# Patient Record
Sex: Male | Born: 1940
Health system: Southern US, Community
[De-identification: ages and names within clinical notes are randomized; demographics above are authoritative.]

## PROBLEM LIST (undated history)

## (undated) DIAGNOSIS — K611 Rectal abscess: Secondary | ICD-10-CM

## (undated) DIAGNOSIS — I2510 Atherosclerotic heart disease of native coronary artery without angina pectoris: Secondary | ICD-10-CM

## (undated) DIAGNOSIS — E785 Hyperlipidemia, unspecified: Secondary | ICD-10-CM

## (undated) DIAGNOSIS — J329 Chronic sinusitis, unspecified: Secondary | ICD-10-CM

## (undated) DIAGNOSIS — D649 Anemia, unspecified: Secondary | ICD-10-CM

## (undated) DIAGNOSIS — K284 Chronic or unspecified gastrojejunal ulcer with hemorrhage: Secondary | ICD-10-CM

## (undated) DIAGNOSIS — I10 Essential (primary) hypertension: Secondary | ICD-10-CM

## (undated) DIAGNOSIS — K274 Chronic or unspecified peptic ulcer, site unspecified, with hemorrhage: Secondary | ICD-10-CM

## (undated) DIAGNOSIS — I4891 Unspecified atrial fibrillation: Secondary | ICD-10-CM

## (undated) HISTORY — DX: Chronic or unspecified peptic ulcer, site unspecified, with hemorrhage: K27.4

## (undated) HISTORY — DX: Unspecified atrial fibrillation: I48.91

## (undated) HISTORY — DX: Chronic or unspecified gastrojejunal ulcer with hemorrhage: K28.4

## (undated) HISTORY — DX: Rectal abscess: K61.1

## (undated) HISTORY — DX: Essential (primary) hypertension: I10

## (undated) HISTORY — DX: Chronic sinusitis, unspecified: J32.9

## (undated) HISTORY — DX: Atherosclerotic heart disease of native coronary artery without angina pectoris: I25.10

## (undated) HISTORY — DX: Hyperlipidemia, unspecified: E78.5

## (undated) HISTORY — DX: Anemia, unspecified: D64.9

---

## 2009-05-30 ENCOUNTER — Encounter: Payer: Self-pay | Admitting: Cardiology

## 2009-05-30 ENCOUNTER — Ambulatory Visit: Payer: Self-pay | Admitting: Thoracic Surgery (Cardiothoracic Vascular Surgery)

## 2009-05-30 HISTORY — PX: CORONARY ARTERY BYPASS GRAFT: SHX141

## 2009-05-31 ENCOUNTER — Inpatient Hospital Stay (HOSPITAL_COMMUNITY): Admission: RE | Admit: 2009-05-31 | Discharge: 2009-06-08 | Payer: Self-pay | Admitting: Family Medicine

## 2009-05-31 ENCOUNTER — Encounter: Payer: Self-pay | Admitting: Cardiology

## 2009-06-06 ENCOUNTER — Encounter: Payer: Self-pay | Admitting: Thoracic Surgery (Cardiothoracic Vascular Surgery)

## 2009-06-30 ENCOUNTER — Encounter
Admission: RE | Admit: 2009-06-30 | Discharge: 2009-06-30 | Payer: Self-pay | Admitting: Thoracic Surgery (Cardiothoracic Vascular Surgery)

## 2009-06-30 ENCOUNTER — Encounter: Payer: Self-pay | Admitting: Cardiology

## 2009-06-30 ENCOUNTER — Ambulatory Visit: Payer: Self-pay | Admitting: Thoracic Surgery (Cardiothoracic Vascular Surgery)

## 2009-07-02 ENCOUNTER — Encounter: Payer: Self-pay | Admitting: Cardiology

## 2009-07-08 ENCOUNTER — Ambulatory Visit: Payer: Self-pay | Admitting: Cardiology

## 2009-07-08 LAB — CONVERTED CEMR LAB: POC INR: 1.7

## 2009-07-14 ENCOUNTER — Ambulatory Visit: Payer: Self-pay | Admitting: Thoracic Surgery (Cardiothoracic Vascular Surgery)

## 2009-07-14 ENCOUNTER — Encounter: Payer: Self-pay | Admitting: Cardiology

## 2009-07-14 ENCOUNTER — Encounter
Admission: RE | Admit: 2009-07-14 | Discharge: 2009-07-14 | Payer: Self-pay | Admitting: Thoracic Surgery (Cardiothoracic Vascular Surgery)

## 2009-07-14 LAB — CONVERTED CEMR LAB: POC INR: 1.4

## 2009-07-22 ENCOUNTER — Ambulatory Visit: Payer: Self-pay | Admitting: Cardiology

## 2009-08-01 ENCOUNTER — Ambulatory Visit: Payer: Self-pay | Admitting: Cardiology

## 2009-08-01 ENCOUNTER — Encounter: Payer: Self-pay | Admitting: Cardiology

## 2009-08-01 LAB — CONVERTED CEMR LAB: POC INR: 2

## 2009-08-05 ENCOUNTER — Encounter
Admission: RE | Admit: 2009-08-05 | Discharge: 2009-08-05 | Payer: Self-pay | Admitting: Thoracic Surgery (Cardiothoracic Vascular Surgery)

## 2009-08-05 ENCOUNTER — Ambulatory Visit: Payer: Self-pay | Admitting: Thoracic Surgery (Cardiothoracic Vascular Surgery)

## 2009-08-18 DIAGNOSIS — I10 Essential (primary) hypertension: Secondary | ICD-10-CM | POA: Insufficient documentation

## 2009-08-18 DIAGNOSIS — R072 Precordial pain: Secondary | ICD-10-CM

## 2009-08-18 DIAGNOSIS — E785 Hyperlipidemia, unspecified: Secondary | ICD-10-CM | POA: Insufficient documentation

## 2009-08-18 DIAGNOSIS — I4891 Unspecified atrial fibrillation: Secondary | ICD-10-CM | POA: Insufficient documentation

## 2009-08-18 DIAGNOSIS — I2510 Atherosclerotic heart disease of native coronary artery without angina pectoris: Secondary | ICD-10-CM | POA: Insufficient documentation

## 2009-08-19 ENCOUNTER — Ambulatory Visit: Payer: Self-pay | Admitting: Cardiology

## 2009-08-19 DIAGNOSIS — I2589 Other forms of chronic ischemic heart disease: Secondary | ICD-10-CM | POA: Insufficient documentation

## 2009-08-19 LAB — CONVERTED CEMR LAB: POC INR: 2.2

## 2009-09-04 ENCOUNTER — Encounter: Payer: Self-pay | Admitting: Cardiology

## 2009-09-09 ENCOUNTER — Ambulatory Visit: Payer: Self-pay | Admitting: Cardiology

## 2009-09-09 LAB — CONVERTED CEMR LAB: POC INR: 1.7

## 2009-09-26 ENCOUNTER — Ambulatory Visit: Payer: Self-pay | Admitting: Cardiology

## 2009-10-17 ENCOUNTER — Ambulatory Visit: Payer: Self-pay | Admitting: Cardiology

## 2009-11-04 ENCOUNTER — Ambulatory Visit: Payer: Self-pay | Admitting: Cardiology

## 2009-11-04 ENCOUNTER — Encounter: Payer: Self-pay | Admitting: Cardiology

## 2009-11-10 ENCOUNTER — Ambulatory Visit: Payer: Self-pay | Admitting: Cardiology

## 2009-11-10 ENCOUNTER — Encounter: Payer: Self-pay | Admitting: Cardiology

## 2009-11-19 ENCOUNTER — Encounter: Payer: Self-pay | Admitting: Cardiology

## 2009-11-25 ENCOUNTER — Encounter (INDEPENDENT_AMBULATORY_CARE_PROVIDER_SITE_OTHER): Payer: Self-pay | Admitting: *Deleted

## 2009-12-01 ENCOUNTER — Telehealth: Payer: Self-pay | Admitting: Cardiology

## 2009-12-02 ENCOUNTER — Encounter: Payer: Self-pay | Admitting: Cardiology

## 2009-12-09 ENCOUNTER — Telehealth: Payer: Self-pay | Admitting: Cardiology

## 2010-04-07 ENCOUNTER — Ambulatory Visit: Payer: Self-pay | Admitting: Cardiology

## 2010-04-14 ENCOUNTER — Encounter: Payer: Self-pay | Admitting: Cardiology

## 2010-05-01 ENCOUNTER — Encounter (INDEPENDENT_AMBULATORY_CARE_PROVIDER_SITE_OTHER): Payer: Self-pay | Admitting: *Deleted

## 2010-06-02 NOTE — Assessment & Plan Note (Signed)
Summary: NP-ESTABLISH WITH CARDIOLOGIST CLOSER TO HOME   Visit Type:  Initial Consult Primary Provider:  Dr. Wyvonnia Lora  CC:  CAD and CABG.  History of Present Illness: The patient is presenting as a new patient to our practice. He has a history of a microinfarction and bypass grafting urgently in January of this year. At that time he had three-vessel coronary artery disease. He had an EF of 30% felt possibly to be stunned or hibernating myocardium. He was taken urgently to bypass surgery with anatomy as described below. He did have postoperative anemia and atrial fibrillation. He was maintained after discharge on amiodarone and is taking Coumadin. The amiodarone was discontinued in the past week.  Since discharge and recovering from pleural effusions and volume overload he has done well. He is walking routinely. With this he denies any of the chest discomfort that was his previous angina. He denies any shortness of breath and has had no resting shortness of breath, PND or orthopnea. He has had no palpitations, presyncope or syncope. He has had a steady weight loss of fluid and with diet. He has had no swelling cough fevers or chills.  Preventive Screening-Counseling & Management  Alcohol-Tobacco     Smoking Status: never  Current Medications (verified): 1)  Warfarin Sodium 2 Mg Tabs (Warfarin Sodium) .... Use As Directed By Anticoagualtion Clinic 2)  Crestor 10 Mg Tabs (Rosuvastatin Calcium) .... Take 1 Tablet By Mouth Once A Day 3)  Metoprolol Tartrate 50 Mg Tabs (Metoprolol Tartrate) .... Take 1&1/2 Tablet By Mouth Two Times A Day 4)  Aspir-Low 81 Mg Tbec (Aspirin) .... Take 1 Tablet By Mouth Once A Day 5)  Fish Oil 1000 Mg Caps (Omega-3 Fatty Acids) .... Take 1 Tablet By Mouth Once A Day 6)  Multivitamins  Tabs (Multiple Vitamin) .... Take 1 Tablet By Mouth Once A Day  Allergies: No Known Drug Allergies  Comments:  Nurse/Medical Assistant: The patient's medications were reviewed  with the patient and were updated in the Medication List. Pt brought a list of medications to office visit.  Cyril Loosen, RN, BSN (August 19, 2009 1:50 PM)  Past History:  Past Medical History:  1. Bleeding ulcer 1 year    2. Rectal abscess.   3. Recurrent sinus infections.   4. Post operatvie atrial fibrillation  5. Post operative anemia  6. Cardiomyopathy (30% at the time of Cath)  7. CAD     Past Surgical History: CABG  (May 30 2009 Median sternotomy, extracorporeal circulation, coronary   artery bypass grafting x6 lleft internal mammary artery to left anterior   descending, sequential saphenous vein graft to ramus intermedius   branches B and C, saphenous vein graft obtuse marginal-2, saphenous vein   graft sequentially to acute marginal and distal right coronary),   Endoscopic vein harvest, right leg.      Family History:  FAMILY HISTORY:  Father died of myocardial infarction at age 87.  His  mother died from liver failure.  He had one brother who committed suicide at age 92 after finding out about a diagnosis of cancer.      Social History: He is a nonsmoker, nondrinker.  He is a retired Music therapist.  He is married.  He has adult children.  Smoking Status:  never  Review of Systems       As stated in the HPI and negative for all other systems.   Vital Signs:  Patient profile:   70 year old male Height:  72 inches Weight:      201.75 pounds BMI:     27.46 Pulse rate:   46 / minute BP sitting:   131 / 81  (left arm) Cuff size:   regular  Vitals Entered By: Cyril Loosen, RN, BSN (August 19, 2009 1:48 PM)  Nutrition Counseling: Patient's BMI is greater than 25 and therefore counseled on weight management options. CC: CAD, CABG Comments Establish with Cardiology here   Physical Exam  General:  Well developed, well nourished, in no acute distress. Head:  normocephalic and atraumatic Eyes:  PERRLA/EOM intact; conjunctiva and lids normal. Mouth:  Teeth,  gums and palate normal. Oral mucosa normal. Neck:  Neck supple, no JVD. No masses, thyromegaly or abnormal cervical nodes. Chest Surman:  well healed sternotomy scar Lungs:  Clear bilaterally to auscultation and percussion. Abdomen:  Bowel sounds positive; abdomen soft and non-tender without masses, organomegaly, or hernias noted. No hepatosplenomegaly. Msk:  Back normal, normal gait. Muscle strength and tone normal. Extremities:  No clubbing or cyanosis. Neurologic:  Alert and oriented x 3. Skin:  Intact without lesions or rashes. Cervical Nodes:  no significant adenopathy Axillary Nodes:  no significant adenopathy Inguinal Nodes:  no significant adenopathy Psych:  Normal affect.   Detailed Cardiovascular Exam  Neck    Carotids: soft left carotid bruit    Neck Veins: Normal, no JVD.    Heart    Inspection: no deformities or lifts noted.      Palpation: normal PMI with no thrills palpable.      Auscultation: regular rate and rhythm, S1, S2 without murmurs, rubs, gallops, or clicks.    Vascular    Abdominal Aorta: no palpable masses, pulsations, or audible bruits.      Femoral Pulses: normal femoral pulses bilaterally.      Pedal Pulses: normal pedal pulses bilaterally.      Radial Pulses: normal radial pulses bilaterally.      Peripheral Circulation: no clubbing, cyanosis, or edema noted with normal capillary refill.     EKG  Procedure date:  08/19/2009  Findings:      sinus bradycardia, rate 47, axis within normal limits, QT prolonged  Impression & Recommendations:  Problem # 1:  CARDIOMYOPATHY, ISCHEMIC (ICD-414.8) The patient had a moderately reduced ejection fraction at the time of his bypass. I will repeat an echocardiogram in July as I suspect this is improved with revascularization. For now he will continue the meds as listed.  Problem # 2:  HYPERLIPIDEMIA (ICD-272.4) I will check a lipid profile and liver enzymes fasting. Orders: T-Lipid Profile  ([id number])  Problem # 3:  ATRIAL FIBRILLATION (ICD-427.31) He has stopped his amiodarone recently. If he has no evidence of recurrent atrial fibrillation when I see him back in July I will discontinue the Coumadin. Of note I will repeat an EKG as I suspect the QT prolongation is related to the amiodarone. Orders: EKG w/ Interpretation (93000)  Problem # 4:  CORONARY ATHEROSCLEROSIS NATIVE CORONARY ARTERY (ICD-414.01) We will continue with risk reduction. Orders: T-Lipid Profile [phone])  Other Orders: T-Hepatic Function [id number])  Patient Instructions: 1)  Your physician wants you to follow-up in: 4 MONTHS,after you've had your echo,and carotid doppler.  You will receive a reminder letter in the mail about two months in advance. If you don't receive a letter, please call our office to schedule the follow-up appointment. 2)  Your physician recommends that you continue on your current medications as directed. Please refer to the Current Medication list given  to you today. 3)  Your physician has requested that you have a carotid duplex in July 2011. This test is an ultrasound of the carotid arteries in your neck. It looks at blood flow through these arteries that supply the brain with blood. Allow one hour for this exam. There are no restrictions or special instructions. 4)  Your physician has requested that you have an echocardiogram i July 2011.  Echocardiography is a painless test that uses sound waves to create images of your heart. It provides your doctor with information about the size and shape of your heart and how well your heart's chambers and valves are working.  This procedure takes approximately one hour. There are no restrictions for this procedure. 5)  Both these test will be scheduled by [facility] and she will call you and schedule these.

## 2010-06-02 NOTE — Progress Notes (Signed)
Summary: pulse rate low  Phone Note Call from Patient Call back at Home Phone [phone]   Caller: patient walk in Reason for Call: Talk to Nurse Details for Reason: concerned about Heart Rate Summary of Call: Would like for the nurse to call him in reference to his BP& pulse he dropped off last time for Hochrein to review.  He said that his rate is staying low in his opinion.  Initial call taken by: Claudette Laws,  December 09, 2009 11:00 AM  Follow-up for Phone Call        Pt states he brought by BP/HR readings by the office at the first of August. He states while doing these readings he noticed his HR was int he 40's. He called the [facility] office and was told to decrease metoprolol from 75mg  two times a day to 50mg  two times a day. He states since then his HR stayes around the 50's. He states it is 51 now. Pt states other than some dizziness when he first stands (that he thinks may be side effect to meds) he feels fine. He has been walking 1 mile/day. Pt would like to know if it is okay that his HR is in the 50's.  Follow-up by: Cyril Loosen, RN, BSN,  December 09, 2009 4:10 PM  Additional Follow-up for Phone Call Additional follow up Details #1::        Pt walked into office today. Concerned that HR is now in the low to mid 40's. He states he feels a little tired especially when he begins his walk. Pt has been taking Metoprolol 50mg  two times a day. He is planning to take 1/2 tablet two times a day until he hears from Dr. Antoine Poche. Additional Follow-up by: Cyril Loosen, RN, BSN,  December 11, 2009 10:00 AM    Additional Follow-up for Phone Call Additional follow up Details #2::    Ok to take 1/2 dose daily and follow heart rate. Follow-up by: Rollene Rotunda, MD, [facility],  December 11, 2009 1:51 PM  New/Updated Medications: METOPROLOL TARTRATE 50 MG TABS (METOPROLOL TARTRATE) Take one tablet by mouth twice a day  Appended Document: pulse rate low Pt verbalized  understanding.   Clinical Lists Changes  Medications: Changed medication from METOPROLOL TARTRATE 50 MG TABS (METOPROLOL TARTRATE) Take one tablet by mouth twice a day to METOPROLOL TARTRATE 50 MG TABS (METOPROLOL TARTRATE) Take 1/2 tablet by mouth twice a day

## 2010-06-02 NOTE — Letter (Signed)
Summary: TCT OFFICE VISIT  TCT OFFICE VISIT   Imported By: Zachary George 08/18/2009 16:21:55  _____________________________________________________________________  External Attachment:    Type:   Image     Comment:   External Document

## 2010-06-02 NOTE — Progress Notes (Signed)
Summary: pt heart rate is low  Phone Note Call from Patient Call back at Home Phone [phone]   Caller: Patient Reason for Call: Talk to Nurse, Talk to Doctor Summary of Call: per pt his heart rate is been in the 40's and this has been for about a week and he is on metaprolol 75mg  two times a day and he was wondering dose he needs to be seen right away or what dose he need to do Initial call taken by: Omer Jack,  December 01, 2009 12:55 PM  Follow-up for Phone Call        PER  DR [place] Emms HOLD EVENING DOSE OF METOPROLOL .  MONITOR HR IF RETURNS INTO THE 60'S  RESUME METOPROLOL TOMMORROW  AT 50 MG two times a day IF NO IMPROVEMENT NEEDS TO BE SEEN  IN OFFICE. PT AWARE. Follow-up by: Scherrie Bateman, LPN,  December 01, 2009 1:16 PM

## 2010-06-02 NOTE — Letter (Signed)
Summary: TCT OFFICE VISIT  TCT OFFICE VISIT   Imported By: Zachary George 08/18/2009 16:22:31  _____________________________________________________________________  External Attachment:    Type:   Image     Comment:   External Document

## 2010-06-02 NOTE — Medication Information (Signed)
Summary: ccr at MD appt-lr  Anticoagulant Therapy  Managed by: Vashti Hey, RN Supervising MD: Antoine Poche MD, Fayrene Fearing Indication 1: Atrial Fibrillation Lab Used: [facility] [place] Site: [place] INR POC 2.2  Dietary changes: no    Health status changes: no    Bleeding/hemorrhagic complications: no    Recent/future hospitalizations: no    Any changes in medication regimen? no    Recent/future dental: no  Any missed doses?: no       Is patient compliant with meds? yes       Allergies: No Known Drug Allergies  Anticoagulation Management History:      The patient is taking warfarin and comes in today for a routine follow up visit.  Positive risk factors for bleeding include an age of 70 years or older.  The bleeding index is 'intermediate risk'.  Positive CHADS2 values include History of HTN.  Negative CHADS2 values include Age > 83 years old.  Anticoagulation responsible provider: Antoine Poche MD, Fayrene Fearing.  INR POC: 2.2.  Cuvette Lot#: [id number].    Anticoagulation Management Assessment/Plan:      The patient's current anticoagulation dose is Warfarin sodium 2 mg tabs: Use as directed by Anticoagualtion Clinic.  The target INR is 2.0-3.0.  The next INR is due 09/09/2009.  Anticoagulation instructions were given to patient.  Results were reviewed/authorized by Vashti Hey, RN.  He was notified by Vashti Hey RN.        Coagulation management information includes: Finished amiodarone 08/17/09   crestor new  on fish oil  .  Prior Anticoagulation Instructions: INR 2.0 Take coumadin 2mg  tonight then increase dose to 2mg  once daily except 1mg  on Mondays and Fridays  Current Anticoagulation Instructions: INR 2.2 Continue coumadin 2mg  once daily except 1mg  on Mondays and Fridays

## 2010-06-02 NOTE — Letter (Signed)
Summary: External Correspondence/ OFFICE NOTES [facility]  External Correspondence/ OFFICE NOTES [facility]   Imported By: Dorise Hiss 08/14/2009 10:11:10  _____________________________________________________________________  External Attachment:    Type:   Image     Comment:   External Document

## 2010-06-02 NOTE — Medication Information (Signed)
Summary: Coumadin Clinic  Anticoagulant Therapy  Managed by: Deboraha Sprang cardiolgy today Supervising MD: Dietrich Pates MD, Molly Maduro Indication 1: Atrial Fibrillation Lab Used: [facility] [place] Site: [place] INR POC 1.4           Anticoagulation Management History:      His anticoagulation is being managed by telephone today.  Positive risk factors for bleeding include an age of 70 years or older.  The bleeding index is 'intermediate risk'.  Negative CHADS2 values include Age > 87 years old.  Anticoagulation responsible provider: Dietrich Pates MD, Molly Maduro.  INR POC: 1.4.    Anticoagulation Management Assessment/Plan:      The next INR is due 07/28/2009.  Anticoagulation instructions were given to patient.  Results were reviewed/authorized by [employer] cardiolgy today.  He was notified by [facility] cardiology.         Prior Anticoagulation Instructions: INR 1.7 Pt has been taking coumadin 1mg  once daily except 0.5mg  on Wednesdays and Saturdays Pt will have next INR check 07/14/09 at [facility] at MD appt and call results to me.  Future INR's will be checked here.  Current Anticoagulation Instructions: Pt had INR checked at [facility] today at MD appt.  INR was 1.4  They will adjust coumadin today and pt will call for f/u appt with me for continued care.  Do not know how they adjusted.  Pt will bring instructions when he comes.

## 2010-06-02 NOTE — Medication Information (Signed)
Summary: ccr-lr  Anticoagulant Therapy  Managed by: Vashti Hey, RN PCP: Dr. Wyvonnia Lora Supervising MD: Andee Lineman MD, Michelle Piper Indication 1: Atrial Fibrillation Lab Used: [facility] [place] Site: [place] INR POC 2.3  Dietary changes: no    Health status changes: no    Bleeding/hemorrhagic complications: no    Recent/future hospitalizations: no    Any changes in medication regimen? no    Recent/future dental: no  Any missed doses?: no       Is patient compliant with meds? yes       Allergies: No Known Drug Allergies  Anticoagulation Management History:      The patient is taking warfarin and comes in today for a routine follow up visit.  Positive risk factors for bleeding include an age of 70 years or older.  The bleeding index is 'intermediate risk'.  Positive CHADS2 values include History of HTN.  Negative CHADS2 values include Age > 70 years old.  Anticoagulation responsible provider: Andee Lineman MD, Michelle Piper.  INR POC: 2.3.  Cuvette Lot#: [id number].    Anticoagulation Management Assessment/Plan:      The patient's current anticoagulation dose is Warfarin sodium 2 mg tabs: Use as directed by Anticoagualtion Clinic.  The target INR is 2.0-3.0.  The next INR is due 11/11/2009.  Anticoagulation instructions were given to patient.  Results were reviewed/authorized by Vashti Hey, RN.  He was notified by Vashti Hey RN.         Prior Anticoagulation Instructions: INR 2.0 Increase coumadin to 2mg  once daily except 3mg  on Tuesdays and Fridays  Current Anticoagulation Instructions: INR 2.3 Continue coumadin 2mg  once daily except 3mg  on Tuesdays and Fridays

## 2010-06-02 NOTE — Medication Information (Signed)
Summary: ccr-lr  Anticoagulant Therapy  Managed by: Vashti Hey, RN PCP: Dr. Wyvonnia Lora Supervising MD: Andee Lineman MD, Michelle Piper Indication 1: Atrial Fibrillation Lab Used: [facility] [place] Site: [place] INR POC 1.7  Dietary changes: no    Health status changes: no    Bleeding/hemorrhagic complications: no    Recent/future hospitalizations: no    Any changes in medication regimen? no    Recent/future dental: no  Any missed doses?: no       Is patient compliant with meds? yes       Allergies: No Known Drug Allergies  Anticoagulation Management History:      The patient is taking warfarin and comes in today for a routine follow up visit.  Positive risk factors for bleeding include an age of 14 years or older.  The bleeding index is 'intermediate risk'.  Positive CHADS2 values include History of HTN.  Negative CHADS2 values include Age > 56 years old.  Anticoagulation responsible provider: Andee Lineman MD, Michelle Piper.  INR POC: 1.7.  Cuvette Lot#: [id number].    Anticoagulation Management Assessment/Plan:      The patient's current anticoagulation dose is Warfarin sodium 2 mg tabs: Use as directed by Anticoagualtion Clinic.  The target INR is 2.0-3.0.  The next INR is due 09/26/2009.  Anticoagulation instructions were given to patient.  Results were reviewed/authorized by Vashti Hey, RN.  He was notified by Vashti Hey RN.         Prior Anticoagulation Instructions: INR 2.2 Continue coumadin 2mg  once daily except 1mg  on Mondays and Fridays  Current Anticoagulation Instructions: INR 1.7 Increase coumadin to 2mg  once daily

## 2010-06-02 NOTE — Letter (Signed)
Summary: [facility]  (DR. Lorrinda Ramstad)  [facility]  (DR. Cleotilde Spadaccini)   Imported By: Zachary George 08/18/2009 16:17:10  _____________________________________________________________________  External Attachment:    Type:   Image     Comment:   External Document

## 2010-06-02 NOTE — Letter (Signed)
Summary: Engineer, materials at [facility]  [address]   [place], [place] [address]   Phone: [phone]  Fax: [phone]        November 25, 2009 MRN: [id number]    [facility] [address] [place], [place]  [address]    Dear Mr. Daleen Squibb,  Your test ordered by Selena Batten has been reviewed by your physician (or physician assistant) and was found to be normal or stable. Your physician (or physician assistant) felt no changes were needed at this time.  ____ Echocardiogram  ____ Cardiac Stress Test  _X___ Lab Work  ____ Peripheral vascular study of arms, legs or neck  ____ CT scan or X-ray  ____ Lung or Breathing test  ____ Other:   Thank you.   Cyril Loosen, RN, BSN    Duane Boston, M.D., F.A.C.C. Thressa Sheller, M.D., F.A.C.C. Oneal Grout, M.D., F.A.C.C. Cheree Ditto, M.D., F.A.C.C. Daiva Nakayama, M.D., F.A.C.C. Kenney Houseman, M.D., F.A.C.C. Jeanne Ivan, PA-C

## 2010-06-02 NOTE — Letter (Signed)
Summary: External Correspondence/ [facility]  External Correspondence/ [facility]   Imported By: Dorise Hiss 07/02/2009 14:03:25  _____________________________________________________________________  External Attachment:    Type:   Image     Comment:   External Document

## 2010-06-02 NOTE — Letter (Signed)
Summary: Internal Other/ PATIENT HISTORY FORM  Internal Other/ PATIENT HISTORY FORM   Imported By: Dorise Hiss 08/01/2009 13:48:41  _____________________________________________________________________  External Attachment:    Type:   Image     Comment:   External Document

## 2010-06-02 NOTE — Assessment & Plan Note (Signed)
Summary: 6 mo fu per nov reminder   Visit Type:  Follow-up Primary Lorely Bubb:  Dr. Wyvonnia Lora  CC:  CAD.  History of Present Illness: The patient presents for 6 month followup. Since I last saw him he has done well. Walking everyday up to 9 miles. He had an echocardiogram in July which demonstrated ischemia and 50%. He had previously been 30%. He was bradycardic and little bit dizzy and we reduced his beta blocker a few months ago. He currently denies any chest pressure, neck or arm discomfort. He said no shortness of breath PND or orthopnea  Preventive Screening-Counseling & Management  Alcohol-Tobacco     Smoking Status: never  Current Medications (verified): 1)  Crestor 10 Mg Tabs (Rosuvastatin Calcium) .... Take 1 Tablet By Mouth Once A Day 2)  Metoprolol Tartrate 50 Mg Tabs (Metoprolol Tartrate) .... Take 1/2 Tablet By Mouth Twice A Day 3)  Aspir-Low 81 Mg Tbec (Aspirin) .... Take 1 Tablet By Mouth Once A Day 4)  Fish Oil 1000 Mg Caps (Omega-3 Fatty Acids) .... Take 1 Tablet By Mouth Once A Day 5)  Multivitamins  Tabs (Multiple Vitamin) .... Take 1 Tablet By Mouth Once A Day 6)  Lisinopril 5 Mg Tabs (Lisinopril) .... Take One Tablet By Mouth Daily  Allergies (verified): No Known Drug Allergies  Comments:  Nurse/Medical Assistant: The patient's medication list and allergies were reviewed with the patient and were updated in the Medication and Allergy Lists.  Past History:  Past Medical History:  1. Bleeding ulcer 1 year    2. Rectal abscess.   3. Recurrent sinus infections.   4. Post operatvie atrial fibrillation  5. Post operative anemia  6. Cardiomyopathy (30% at the time of Cath, 45 - 50% echo 7/11)  7. CAD     Past Surgical History: Reviewed history from 08/19/2009 and no changes required. CABG  (May 30 2009 Median sternotomy, extracorporeal circulation, coronary   artery bypass grafting x6 lleft internal mammary artery to left anterior   descending,  sequential saphenous vein graft to ramus intermedius   branches B and C, saphenous vein graft obtuse marginal-2, saphenous vein   graft sequentially to acute marginal and distal right coronary),   Endoscopic vein harvest, right leg.      Vital Signs:  Patient profile:   70 year old male Height:      73 inches Weight:      208 pounds Pulse rate:   50 / minute BP sitting:   128 / 78  (left arm) Cuff size:   large  Vitals Entered By: Carlye Grippe (April 07, 2010 9:24 AM)  Physical Exam  General:  Well developed, well nourished, in no acute distress. Head:  normocephalic and atraumatic Neck:  Neck supple, no JVD. No masses, thyromegaly or abnormal cervical nodes. Chest Demarais:  well healed sternotomy scar Lungs:  Clear bilaterally to auscultation and percussion. Abdomen:  Bowel sounds positive; abdomen soft and non-tender without masses, organomegaly, or hernias noted. No hepatosplenomegaly. Msk:  Back normal, normal gait. Muscle strength and tone normal. Extremities:  No clubbing or cyanosis. Neurologic:  Alert and oriented x 3. Cervical Nodes:  no significant adenopathy Psych:  Normal affect.   Detailed Cardiovascular Exam  Neck    Carotids: soft left carotid bruit    Neck Veins: Normal, no JVD.    Heart    Inspection: no deformities or lifts noted.      Palpation: normal PMI with no thrills palpable.  Auscultation: regular rate and rhythm, S1, S2 without murmurs, rubs, gallops, or clicks.    Vascular    Abdominal Aorta: no palpable masses, pulsations, or audible bruits.      Femoral Pulses: normal femoral pulses bilaterally.      Pedal Pulses: normal pedal pulses bilaterally.      Radial Pulses: normal radial pulses bilaterally.      Peripheral Circulation: no clubbing, cyanosis, or edema noted with normal capillary refill.     EKG  Procedure date:  04/07/2010  Findings:      CHEST SINUS BRADYCARDIA< AXIS WITHIN NORMAL LIMITS< OR ANTERIOR R-wave  progression, nonspecific T wave changes.  Impression & Recommendations:  Problem # 1:  CARDIOMYOPATHY, ISCHEMIC (ICD-414.8) This is improved. He will continue the meds as listed.  Problem # 2:  HYPERLIPIDEMIA (ICD-272.4) I have given him written instructions were fasting lipid profile. The goal is LDL less than 100 and HDL greater than 40. Orders: T-Lipid Profile [id number]) T-Hepatic Function [id number])  Problem # 3:  ESSENTIAL HYPERTENSION, BENIGN (ICD-401.1) I will not titrate meds see as he has had some slight dizziness and bradycardia. His blood pressure is controlled.  Other Orders: EKG w/ Interpretation ([id number])  Patient Instructions: 1)  Your physician wants you to follow-up in: 1 year. You will receive a reminder letter in the mail one-two months in advance. If you don't receive a letter, please call our office to schedule the follow-up appointment. 2)  Your physician recommends that you have FASTING lipid profile and liver function labs done. Do not eat or drink after midnight.  3)  Your physician recommends that you continue on your current medications as directed. Please refer to the Current Medication list given to you today.

## 2010-06-02 NOTE — Op Note (Signed)
Summary: Operative Report  Operative Report   Imported By: Zachary George 08/18/2009 15:42:45  _____________________________________________________________________  External Attachment:    Type:   Image     Comment:   External Document

## 2010-06-02 NOTE — Letter (Signed)
Summary: CONSULTATION NOTE ([place] [facility])  CONSULTATION NOTE ([facility])   Imported By: Zachary George 08/18/2009 15:35:32  _____________________________________________________________________  External Attachment:    Type:   Image     Comment:   External Document

## 2010-06-02 NOTE — Medication Information (Signed)
Summary: ccr-lr  Anticoagulant Therapy  Managed by: Vashti Hey, RN Supervising MD: Diona Browner MD, Remi Deter Indication 1: Atrial Fibrillation Lab Used: [facility] [place] Site: [facility] INR POC 2.0  Dietary changes: no    Health status changes: no    Bleeding/hemorrhagic complications: no    Recent/future hospitalizations: no    Any changes in medication regimen? no    Recent/future dental: no  Any missed doses?: no       Is patient compliant with meds? yes       Anticoagulation Management History:      The patient is taking warfarin and comes in today for a routine follow up visit.  Positive risk factors for bleeding include an age of 70 years or older.  The bleeding index is 'intermediate risk'.  Negative CHADS2 values include Age > 70 years old.  Anticoagulation responsible provider: Diona Browner MD, Remi Deter.  INR POC: 2.0.  Cuvette Lot#: [id number].    Anticoagulation Management Assessment/Plan:      The patient's current anticoagulation dose is Warfarin sodium 2 mg tabs: Use as directed by Anticoagualtion Clinic.  The target INR is 2.0-3.0.  The next INR is due 08/19/2009.  Anticoagulation instructions were given to patient.  Results were reviewed/authorized by Vashti Hey, RN.  He was notified by Vashti Hey RN.        Coagulation management information includes: amiodarone decreased to 200mg  once daily   Will finish amiodarone around 08/13/09   finishes lasix this wk   crestor new  on fish oil  .  Prior Anticoagulation Instructions: INR 1.8 Increase coumdin to 2mg  once daily except 1mg  on M,W,F Last week he took 1mg  once daily except 2mg  on M,W,F per [facility] cardiology.  Current Anticoagulation Instructions: INR 2.0 Take coumadin 2mg  tonight then increase dose to 2mg  once daily except 1mg  on Mondays and Fridays Prescriptions: WARFARIN SODIUM 2 MG TABS (WARFARIN SODIUM) Use as directed by Anticoagualtion Clinic  #45 x 3   Entered by:   Vashti Hey RN   Authorized by:   Rollene Rotunda, MD, [facility]   Signed by:   Vashti Hey RN on 08/01/2009   Method used:   Electronically to        [facility]* (retail)       [address]       [place], [place]  [address]       Ph: [phone]       Fax: [phone]   RxID:   [id number]

## 2010-06-02 NOTE — Letter (Signed)
Summary: External Correspondence/ OFFICE VISIT [facility]  External Correspondence/ OFFICE VISIT [facility]   Imported By: Dorise Hiss 08/14/2009 10:04:46  _____________________________________________________________________  External Attachment:    Type:   Image     Comment:   External Document

## 2010-06-02 NOTE — Medication Information (Signed)
Summary: ccr  Anticoagulant Therapy  Managed by: Vashti Hey, RN PCP: Dr. Wyvonnia Lora Supervising MD: Andee Lineman MD, Michelle Piper Indication 1: Atrial Fibrillation Lab Used: [facility] [place] Site: [facility] INR POC 2.0  Dietary changes: no    Health status changes: no    Bleeding/hemorrhagic complications: no    Recent/future hospitalizations: no    Any changes in medication regimen? no    Recent/future dental: no  Any missed doses?: no       Is patient compliant with meds? yes       Allergies: No Known Drug Allergies  Anticoagulation Management History:      The patient is taking warfarin and comes in today for a routine follow up visit.  Positive risk factors for bleeding include an age of 70 years or older.  The bleeding index is 'intermediate risk'.  Positive CHADS2 values include History of HTN.  Negative CHADS2 values include Age > 70 years old.  Anticoagulation responsible provider: Andee Lineman MD, Michelle Piper.  INR POC: 2.0.  Cuvette Lot#: [id number].    Anticoagulation Management Assessment/Plan:      The patient's current anticoagulation dose is Warfarin sodium 2 mg tabs: Use as directed by Anticoagualtion Clinic.  The target INR is 2.0-3.0.  The next INR is due 10/17/2009.  Anticoagulation instructions were given to patient.  Results were reviewed/authorized by Vashti Hey, RN.  He was notified by Vashti Hey RN.         Prior Anticoagulation Instructions: INR 1.7 Increase coumadin to 2mg  once daily   Current Anticoagulation Instructions: INR 2.0 Increase coumadin to 2mg  once daily except 3mg  on Tuesdays and Fridays

## 2010-06-02 NOTE — Medication Information (Signed)
Summary: ccr-srs  Anticoagulant Therapy  Managed by: Vashti Hey, RN Supervising MD: Andee Lineman MD, Michelle Piper Indication 1: Atrial Fibrillation Lab Used: [facility] [place] Site: [facility] INR POC 1.8  Dietary changes: no    Health status changes: no    Bleeding/hemorrhagic complications: no    Recent/future hospitalizations: no    Any changes in medication regimen? no    Recent/future dental: no  Any missed doses?: no       Is patient compliant with meds? yes      Comments: Appt made today for pt to get established here with cardiologist.  He has been seeing Mendel Ryder MD [facility] in [place] but wants to transfer care closer to home.  Anticoagulation Management History:      The patient is taking warfarin and comes in today for a routine follow up visit.  Positive risk factors for bleeding include an age of 70 years or older.  The bleeding index is 'intermediate risk'.  Negative CHADS2 values include Age > 38 years old.  Anticoagulation responsible provider: Andee Lineman MD, Michelle Piper.  INR POC: 1.8.  Cuvette Lot#: [id number].    Anticoagulation Management Assessment/Plan:      The next INR is due 08/01/2009.  Anticoagulation instructions were given to patient.  Results were reviewed/authorized by Vashti Hey, RN.  He was notified by Vashti Hey RN.         Prior Anticoagulation Instructions: Pt had INR checked at [facility] today at MD appt.  INR was 1.4  They will adjust coumadin today and pt will call for f/u appt with me for continued care.  Do not know how they adjusted.  Pt will bring instructions when he comes.  Current Anticoagulation Instructions: INR 1.8 Increase coumdin to 2mg  once daily except 1mg  on M,W,F Last week he took 1mg  once daily except 2mg  on M,W,F per [facility] cardiology.

## 2010-06-02 NOTE — Medication Information (Signed)
Summary: Coumadin Clinic  Anticoagulant Therapy  Managed by: Inactive PCP: Dr. Wyvonnia Lora Supervising MD: Andee Lineman MD, Michelle Piper Indication 1: Atrial Fibrillation Lab Used: [facility] [place] Site: [place]          Comments: Coumadin discontinued today by Dr Antoine Poche  Allergies: No Known Drug Allergies  Anticoagulation Management History:      Positive risk factors for bleeding include an age of 68 years or older.  The bleeding index is 'intermediate risk'.  Positive CHADS2 values include History of HTN.  Negative CHADS2 values include Age > 70 years old.  Anticoagulation responsible provider: Andee Lineman MD, Michelle Piper.    Anticoagulation Management Assessment/Plan:      The target INR is 2.0-3.0.  The next INR is due 11/11/2009.  Anticoagulation instructions were given to patient.  Results were reviewed/authorized by Inactive.         Prior Anticoagulation Instructions: INR 2.3 Continue coumadin 2mg  once daily except 3mg  on Tuesdays and Fridays

## 2010-06-02 NOTE — Progress Notes (Signed)
Summary: Office Visit/ BLOOD PRESSURE READINGS  Office Visit/ BLOOD PRESSURE READINGS   Imported By: Dorise Hiss 12/02/2009 10:13:37  _____________________________________________________________________  External Attachment:    Type:   Image     Comment:   External Document

## 2010-06-02 NOTE — Cardiovascular Report (Signed)
Summary: Cardiac Cath (DR. [facility])  Cardiac Cath (DR. [facility])   Imported By: Zachary George 08/18/2009 16:15:31  _____________________________________________________________________  External Attachment:    Type:   Image     Comment:   External Document

## 2010-06-02 NOTE — Medication Information (Signed)
Summary: NEW CCR D/C [facility] 06/09/2009  Anticoagulant Therapy  Managed by: Vashti Hey, RN Supervising MD: Andee Lineman MD, Michelle Piper Indication 1: Atrial Fibrillation Lab Used: [facility] [place] Site: [place] INR POC 1.7  Dietary changes: no    Health status changes: no    Bleeding/hemorrhagic complications: no    Recent/future hospitalizations: yes       Details: [facility] 05/30/09 - 06/08/09 S/P CABG x 6, post op a fib  Any changes in medication regimen? yes       Details: has been on coumadin managed by [facility] cardiology  Started on amiodarone 400mg  BID x 14 days then 200mg  bid  Recent/future dental: no  Any missed doses?: no       Is patient compliant with meds? yes       Anticoagulation Management History:      The patient comes in today for his initial visit for anticoagulation therapy.  Positive risk factors for bleeding include an age of 70 years or older.  The bleeding index is 'intermediate risk'.  Negative CHADS2 values include Age > 4 years old.  Anticoagulation responsible provider: Andee Lineman MD, Michelle Piper.  INR POC: 1.7.  Cuvette Lot#: [id number].    Anticoagulation Management Assessment/Plan:      The next INR is due 07/14/2009.  Anticoagulation instructions were given to patient.  Results were reviewed/authorized by Vashti Hey, RN.  He was notified by Vashti Hey RN.        Coagulation management information includes: amiodarone decreased to 200mg  once daily   finishes lasix this wk   crestor new  on fish oil  .  Current Anticoagulation Instructions: INR 1.7 Pt has been taking coumadin 1mg  once daily except 0.5mg  on Wednesdays and Saturdays Pt will have next INR check 07/14/09 at [facility] at MD appt and call results to me.  Future INR's will be checked here.

## 2010-06-02 NOTE — Assessment & Plan Note (Signed)
Summary: 3 MO FU PER JULY REMINDER-SRS   Visit Type:  Follow-up Primary Provider:  Dr. Wyvonnia Lora  CC:  Atrial Fibrillation/CAD.  History of Present Illness: The patient returns for followup of the above. Since I last saw him he has had no new cardiovascular problems. He denies any chest pressure, neck or arm discomfort. He has no palpitations, presyncope or syncope. He is exercising routinely. With this he cannot bring on any anginal symptoms. He has had some rare fleeting right arm discomfort which he thought might be similar to previous angina. However, this was short-lived and not reproducible. Of note he did recently have some bleeding in his ear which was difficult to stop.  He did not recall any trauma. He has had no other problems with his chronic Coumadin.  Current Medications (verified): 1)  Warfarin Sodium 2 Mg Tabs (Warfarin Sodium) .... Use As Directed By Anticoagualtion Clinic 2)  Crestor 10 Mg Tabs (Rosuvastatin Calcium) .... Take 1 Tablet By Mouth Once A Day 3)  Metoprolol Tartrate 50 Mg Tabs (Metoprolol Tartrate) .... Take 1&1/2 Tablet By Mouth Two Times A Day 4)  Aspir-Low 81 Mg Tbec (Aspirin) .... Take 1 Tablet By Mouth Once A Day 5)  Fish Oil 1000 Mg Caps (Omega-3 Fatty Acids) .... Take 1 Tablet By Mouth Once A Day 6)  Multivitamins  Tabs (Multiple Vitamin) .... Take 1 Tablet By Mouth Once A Day  Allergies (verified): No Known Drug Allergies  Past History:  Past Medical History: Reviewed history from 08/19/2009 and no changes required.  1. Bleeding ulcer 1 year    2. Rectal abscess.   3. Recurrent sinus infections.   4. Post operatvie atrial fibrillation  5. Post operative anemia  6. Cardiomyopathy (30% at the time of Cath)  7. CAD     Past Surgical History: Reviewed history from 08/19/2009 and no changes required. CABG  (May 30 2009 Median sternotomy, extracorporeal circulation, coronary   artery bypass grafting x6 lleft internal mammary artery to left  anterior   descending, sequential saphenous vein graft to ramus intermedius   branches B and C, saphenous vein graft obtuse marginal-2, saphenous vein   graft sequentially to acute marginal and distal right coronary),   Endoscopic vein harvest, right leg.      Review of Systems       As stated in the HPI and negative for all other systems.   Vital Signs:  Patient profile:   70 year old male Height:      73 inches Weight:      212 pounds Pulse rate:   43 / minute BP sitting:   175 / 78  (left arm) Cuff size:   regular  Vitals Entered By: Hoover Brunette, LPN (November 04, 1608 12:53 PM)   Physical Exam  General:  Well developed, well nourished, in no acute distress. Head:  normocephalic and atraumatic Eyes:  PERRLA/EOM intact; conjunctiva and lids normal. Mouth:  Teeth, gums and palate normal. Oral mucosa normal. Neck:  Neck supple, no JVD. No masses, thyromegaly or abnormal cervical nodes. Chest Mck[place]:  well healed sternotomy scar Lungs:  Clear bilaterally to auscultation and percussion. Abdomen:  Bowel sounds positive; abdomen soft and non-tender without masses, organomegaly, or hernias noted. No hepatosplenomegaly. Msk:  Back normal, normal gait. Muscle strength and tone normal. Extremities:  No clubbing or cyanosis. Neurologic:  Alert and oriented x 3. Skin:  Intact without lesions or rashes. Cervical Nodes:  no significant adenopathy Axillary Nodes:  no  significant adenopathy Inguinal Nodes:  no significant adenopathy Psych:  Normal affect.   Detailed Cardiovascular Exam  Neck    Carotids: soft left carotid bruit    Neck Veins: Normal, no JVD.    Heart    Inspection: no deformities or lifts noted.      Palpation: normal PMI with no thrills palpable.      Auscultation: regular rate and rhythm, S1, S2 without murmurs, rubs, gallops, or clicks.    Vascular    Abdominal Aorta: no palpable masses, pulsations, or audible bruits.      Femoral Pulses: normal femoral pulses  bilaterally.      Pedal Pulses: normal pedal pulses bilaterally.      Radial Pulses: normal radial pulses bilaterally.      Peripheral Circulation: no clubbing, cyanosis, or edema noted with normal capillary refill.     Impression & Recommendations:  Problem # 1:  CORONARY ATHEROSCLEROSIS NATIVE CORONARY ARTERY (ICD-414.01) The patient does not have any significant cardiovascular symptoms. At this point no further testing is suggested. We will continue with risk reduction. Orders: 2-D Echocardiogram (2D Echo)  Problem # 2:  HYPERLIPIDEMIA (ICD-272.4) I review his lipids from last month and they were reasonable. He will continue on the meds as listed.  Problem # 3:  ESSENTIAL HYPERTENSION, BENIGN (ICD-401.1) His blood pressure is not at target. He says this is unusual but he doesn't check it at home. I will add lisinopril 5 mg daily for management of hypertension, cardiomyopathy and risk reduction. Orders: T-Basic Metabolic Panel ([id number])  Problem # 4:  ATRIAL FIBRILLATION (ICD-427.31) There are no evident paroxysms of this. I think it reasonable to discontinue the Coumadin as this was a post operative arrhythmia only. Orders: 2-D Echocardiogram (2D Echo)  Patient Instructions: 1)  Have Echo done as ordered. 2)  Stop Coumadin. 3)  Start Lisinopril 5mg  by mouth once daily. 4)  Keep a blood pressure diary. Take and record your blood pressure randomly 3 times per week. Bring this record to the office for review after 1 month. 5)  Your physician recommends that you go to the [facility] for lab work: DO IN 2 WEEKS. 6)  Your physician wants you to follow-up in: 4 months. You will receive a reminder letter in the mail one-two months in advance. If you don't receive a letter, please call our office to schedule the follow-up appointment. Prescriptions: LISINOPRIL 5 MG TABS (LISINOPRIL) Take one tablet by mouth daily  #30 x 6   Entered by:   Cyril Loosen, RN, BSN   Authorized by:    Rollene Rotunda, MD, [facility]   Signed by:   Cyril Loosen, RN, BSN on 11/04/2009   Method used:   Electronically to        [facility]* (retail)       [address]       [place], [place]  [address]       Ph: [phone]       Fax: [phone]   RxID:   [id number]  I have reviewed and approved all prescriptions at the time of this visit. Rollene Rotunda, MD, [facility]  November 04, 2009 1:29 PM

## 2010-06-02 NOTE — Letter (Signed)
Summary: TCT OFFICE VISIT   TCT OFFICE VISIT   Imported By: Zachary George 08/18/2009 16:20:07  _____________________________________________________________________  External Attachment:    Type:   Image     Comment:   External Document

## 2010-06-04 NOTE — Letter (Signed)
Summary: Engineer, materials at [facility]  [address]   [place], [place] [address]   Phone: [phone]  Fax: [phone]        May 01, 2010 MRN: [id number]    [facility] [address] [place], [place]  [address]    Dear Larry Castillo,  Your test ordered by Selena Batten has been reviewed by your physician (or physician assistant) and was found to be normal or stable. Your physician (or physician assistant) felt no changes were needed at this time.  ____ Echocardiogram  ____ Cardiac Stress Test  __X__ Lab Work  ____ Peripheral vascular study of arms, legs or neck  ____ CT scan or X-ray  ____ Lung or Breathing test  ____ Other:   Thank you.   Cyril Loosen, RN, BSN    Duane Boston, M.D., F.A.C.C. Thressa Sheller, M.D., F.A.C.C. Oneal Grout, M.D., F.A.C.C. Cheree Ditto, M.D., F.A.C.C. Daiva Nakayama, M.D., F.A.C.C. Kenney Houseman, M.D., F.A.C.C. Jeanne Ivan, PA-C

## 2010-07-20 LAB — CBC
HCT: 26.5 % — ABNORMAL LOW (ref 39.0–52.0)
HCT: 27 % — ABNORMAL LOW (ref 39.0–52.0)
HCT: 28.9 % — ABNORMAL LOW (ref 39.0–52.0)
HCT: 29.7 % — ABNORMAL LOW (ref 39.0–52.0)
Hemoglobin: 15 g/dL (ref 13.0–17.0)
Hemoglobin: 9 g/dL — ABNORMAL LOW (ref 13.0–17.0)
Hemoglobin: 9.9 g/dL — ABNORMAL LOW (ref 13.0–17.0)
MCHC: 33.3 g/dL (ref 30.0–36.0)
MCHC: 33.4 g/dL (ref 30.0–36.0)
MCHC: 33.5 g/dL (ref 30.0–36.0)
MCHC: 34.1 g/dL (ref 30.0–36.0)
MCV: 92.6 fL (ref 78.0–100.0)
MCV: 92.8 fL (ref 78.0–100.0)
Platelets: 112 10*3/uL — ABNORMAL LOW (ref 150–400)
Platelets: 127 10*3/uL — ABNORMAL LOW (ref 150–400)
Platelets: 142 10*3/uL — ABNORMAL LOW (ref 150–400)
RBC: 2.85 MIL/uL — ABNORMAL LOW (ref 4.22–5.81)
RBC: 2.91 MIL/uL — ABNORMAL LOW (ref 4.22–5.81)
RBC: 4.85 MIL/uL (ref 4.22–5.81)
RDW: 13.2 % (ref 11.5–15.5)
RDW: 13.2 % (ref 11.5–15.5)
RDW: 13.6 % (ref 11.5–15.5)
WBC: 13.1 10*3/uL — ABNORMAL HIGH (ref 4.0–10.5)
WBC: 13.7 10*3/uL — ABNORMAL HIGH (ref 4.0–10.5)
WBC: 15.4 10*3/uL — ABNORMAL HIGH (ref 4.0–10.5)

## 2010-07-20 LAB — POCT I-STAT 3, VENOUS BLOOD GAS (G3P V)
O2 Saturation: 73 %
TCO2: 25 mmol/L (ref 0–100)
pCO2, Ven: 41 mmHg — ABNORMAL LOW (ref 45.0–50.0)
pO2, Ven: 40 mmHg (ref 30.0–45.0)

## 2010-07-20 LAB — POCT I-STAT 3, ART BLOOD GAS (G3+)
Acid-base deficit: 1 mmol/L (ref 0.0–2.0)
Bicarbonate: 22.1 mEq/L (ref 20.0–24.0)
Patient temperature: 36.9
TCO2: 23 mmol/L (ref 0–100)
TCO2: 24 mmol/L (ref 0–100)
pCO2 arterial: 35.1 mmHg (ref 35.0–45.0)
pCO2 arterial: 36 mmHg (ref 35.0–45.0)
pCO2 arterial: 38.9 mmHg (ref 35.0–45.0)
pCO2 arterial: 39 mmHg (ref 35.0–45.0)
pH, Arterial: 7.358 (ref 7.350–7.450)
pH, Arterial: 7.402 (ref 7.350–7.450)
pH, Arterial: 7.413 (ref 7.350–7.450)
pH, Arterial: 7.427 (ref 7.350–7.450)
pO2, Arterial: 134 mmHg — ABNORMAL HIGH (ref 80.0–100.0)

## 2010-07-20 LAB — GLUCOSE, CAPILLARY
Glucose-Capillary: 101 mg/dL — ABNORMAL HIGH (ref 70–99)
Glucose-Capillary: 108 mg/dL — ABNORMAL HIGH (ref 70–99)
Glucose-Capillary: 109 mg/dL — ABNORMAL HIGH (ref 70–99)
Glucose-Capillary: 121 mg/dL — ABNORMAL HIGH (ref 70–99)
Glucose-Capillary: 131 mg/dL — ABNORMAL HIGH (ref 70–99)
Glucose-Capillary: 165 mg/dL — ABNORMAL HIGH (ref 70–99)
Glucose-Capillary: 89 mg/dL (ref 70–99)
Glucose-Capillary: 90 mg/dL (ref 70–99)

## 2010-07-20 LAB — POCT I-STAT, CHEM 8
BUN: 11 mg/dL (ref 6–23)
BUN: 14 mg/dL (ref 6–23)
Calcium, Ion: 1.14 mmol/L (ref 1.12–1.32)
Calcium, Ion: 1.17 mmol/L (ref 1.12–1.32)
Chloride: 105 mEq/L (ref 96–112)
Chloride: 105 mEq/L (ref 96–112)
Creatinine, Ser: 1.3 mg/dL (ref 0.4–1.5)
Creatinine, Ser: 2.4 mg/dL — ABNORMAL HIGH (ref 0.4–1.5)
Glucose, Bld: 118 mg/dL — ABNORMAL HIGH (ref 70–99)
Glucose, Bld: 135 mg/dL — ABNORMAL HIGH (ref 70–99)
Glucose, Bld: 140 mg/dL — ABNORMAL HIGH (ref 70–99)
Glucose, Bld: 144 mg/dL — ABNORMAL HIGH (ref 70–99)
HCT: 24 % — ABNORMAL LOW (ref 39.0–52.0)
HCT: 29 % — ABNORMAL LOW (ref 39.0–52.0)
Hemoglobin: 16.3 g/dL (ref 13.0–17.0)
Hemoglobin: 8.2 g/dL — ABNORMAL LOW (ref 13.0–17.0)
Hemoglobin: 9.9 g/dL — ABNORMAL LOW (ref 13.0–17.0)
Hemoglobin: 9.9 g/dL — ABNORMAL LOW (ref 13.0–17.0)
Potassium: 3.8 mEq/L (ref 3.5–5.1)
Potassium: 3.9 mEq/L (ref 3.5–5.1)
Potassium: 4.2 mEq/L (ref 3.5–5.1)
Sodium: 137 mEq/L (ref 135–145)
Sodium: 137 mEq/L (ref 135–145)
Sodium: 139 mEq/L (ref 135–145)
TCO2: 23 mmol/L (ref 0–100)
TCO2: 25 mmol/L (ref 0–100)
TCO2: 27 mmol/L (ref 0–100)

## 2010-07-20 LAB — POCT I-STAT 4, (NA,K, GLUC, HGB,HCT)
Glucose, Bld: 108 mg/dL — ABNORMAL HIGH (ref 70–99)
Glucose, Bld: 114 mg/dL — ABNORMAL HIGH (ref 70–99)
Glucose, Bld: 117 mg/dL — ABNORMAL HIGH (ref 70–99)
Glucose, Bld: 120 mg/dL — ABNORMAL HIGH (ref 70–99)
Glucose, Bld: 120 mg/dL — ABNORMAL HIGH (ref 70–99)
Glucose, Bld: 121 mg/dL — ABNORMAL HIGH (ref 70–99)
HCT: 26 % — ABNORMAL LOW (ref 39.0–52.0)
HCT: 28 % — ABNORMAL LOW (ref 39.0–52.0)
HCT: 29 % — ABNORMAL LOW (ref 39.0–52.0)
HCT: 30 % — ABNORMAL LOW (ref 39.0–52.0)
HCT: 38 % — ABNORMAL LOW (ref 39.0–52.0)
Hemoglobin: 10.2 g/dL — ABNORMAL LOW (ref 13.0–17.0)
Hemoglobin: 13.6 g/dL (ref 13.0–17.0)
Hemoglobin: 9.5 g/dL — ABNORMAL LOW (ref 13.0–17.0)
Potassium: 3.9 mEq/L (ref 3.5–5.1)
Potassium: 4.4 mEq/L (ref 3.5–5.1)
Potassium: 6.4 mEq/L (ref 3.5–5.1)
Potassium: 6.6 mEq/L (ref 3.5–5.1)
Sodium: 124 mEq/L — ABNORMAL LOW (ref 135–145)
Sodium: 136 mEq/L (ref 135–145)

## 2010-07-20 LAB — BASIC METABOLIC PANEL
BUN: 12 mg/dL (ref 6–23)
BUN: 21 mg/dL (ref 6–23)
CO2: 26 mEq/L (ref 19–32)
Calcium: 7.5 mg/dL — ABNORMAL LOW (ref 8.4–10.5)
Chloride: 103 mEq/L (ref 96–112)
Chloride: 104 mEq/L (ref 96–112)
Creatinine, Ser: 1.29 mg/dL (ref 0.4–1.5)
Creatinine, Ser: 2.21 mg/dL — ABNORMAL HIGH (ref 0.4–1.5)
GFR calc Af Amer: 36 mL/min — ABNORMAL LOW (ref >60)
GFR calc Af Amer: 45 mL/min — ABNORMAL LOW (ref >60)
GFR calc non Af Amer: 30 mL/min — ABNORMAL LOW (ref >60)
GFR calc non Af Amer: 55 mL/min — ABNORMAL LOW (ref >60)
Glucose, Bld: 139 mg/dL — ABNORMAL HIGH (ref 70–99)
Potassium: 3.8 mEq/L (ref 3.5–5.1)
Potassium: 4 mEq/L (ref 3.5–5.1)
Potassium: 4 mEq/L (ref 3.5–5.1)
Sodium: 136 mEq/L (ref 135–145)
Sodium: 136 mEq/L (ref 135–145)

## 2010-07-20 LAB — DIFFERENTIAL
Basophils Absolute: 0.1 10*3/uL (ref 0.0–0.1)
Basophils Relative: 0 % (ref 0–1)
Lymphocytes Relative: 10 % — ABNORMAL LOW (ref 12–46)
Monocytes Absolute: 0.8 10*3/uL (ref 0.1–1.0)
Neutro Abs: 9.7 10*3/uL — ABNORMAL HIGH (ref 1.7–7.7)

## 2010-07-20 LAB — CK TOTAL AND CKMB (NOT AT ARMC)
CK, MB: 12 ng/mL (ref 0.3–4.0)
Relative Index: 7.2 — ABNORMAL HIGH (ref 0.0–2.5)

## 2010-07-20 LAB — CROSSMATCH

## 2010-07-20 LAB — PROTIME-INR
INR: 0.98 (ref 0.00–1.49)
INR: 1.57 — ABNORMAL HIGH (ref 0.00–1.49)
Prothrombin Time: 12.9 seconds (ref 11.6–15.2)

## 2010-07-20 LAB — PLATELET COUNT: Platelets: 107 10*3/uL — ABNORMAL LOW (ref 150–400)

## 2010-07-20 LAB — POCT I-STAT GLUCOSE: Operator id: 3342

## 2010-07-20 LAB — BRAIN NATRIURETIC PEPTIDE: Pro B Natriuretic peptide (BNP): 928 pg/mL — ABNORMAL HIGH (ref 0.0–100.0)

## 2010-07-20 LAB — CREATININE, SERUM: GFR calc Af Amer: 52 mL/min — ABNORMAL LOW (ref >60)

## 2010-07-20 LAB — APTT: aPTT: 33 seconds (ref 24–37)

## 2010-07-20 LAB — MAGNESIUM: Magnesium: 2.5 mg/dL (ref 1.5–2.5)

## 2010-07-23 LAB — BASIC METABOLIC PANEL
BUN: 17 mg/dL (ref 6–23)
BUN: 18 mg/dL (ref 6–23)
CO2: 25 mEq/L (ref 19–32)
CO2: 26 mEq/L (ref 19–32)
CO2: 27 mEq/L (ref 19–32)
CO2: 27 mEq/L (ref 19–32)
Calcium: 8.6 mg/dL (ref 8.4–10.5)
Chloride: 100 mEq/L (ref 96–112)
Chloride: 101 mEq/L (ref 96–112)
Chloride: 102 mEq/L (ref 96–112)
Chloride: 102 mEq/L (ref 96–112)
Chloride: 103 mEq/L (ref 96–112)
Creatinine, Ser: 1.56 mg/dL — ABNORMAL HIGH (ref 0.4–1.5)
Creatinine, Ser: 1.63 mg/dL — ABNORMAL HIGH (ref 0.4–1.5)
Creatinine, Ser: 1.67 mg/dL — ABNORMAL HIGH (ref 0.4–1.5)
Creatinine, Ser: 1.76 mg/dL — ABNORMAL HIGH (ref 0.4–1.5)
Creatinine, Ser: 1.8 mg/dL — ABNORMAL HIGH (ref 0.4–1.5)
GFR calc Af Amer: 46 mL/min — ABNORMAL LOW (ref >60)
GFR calc Af Amer: 47 mL/min — ABNORMAL LOW (ref >60)
GFR calc Af Amer: 47 mL/min — ABNORMAL LOW (ref >60)
GFR calc Af Amer: 51 mL/min — ABNORMAL LOW (ref >60)
Glucose, Bld: 102 mg/dL — ABNORMAL HIGH (ref 70–99)
Glucose, Bld: 120 mg/dL — ABNORMAL HIGH (ref 70–99)
Potassium: 4.1 mEq/L (ref 3.5–5.1)
Potassium: 4.6 mEq/L (ref 3.5–5.1)
Sodium: 132 mEq/L — ABNORMAL LOW (ref 135–145)
Sodium: 133 mEq/L — ABNORMAL LOW (ref 135–145)
Sodium: 135 mEq/L (ref 135–145)
Sodium: 138 mEq/L (ref 135–145)

## 2010-07-23 LAB — CBC
HCT: 26.1 % — ABNORMAL LOW (ref 39.0–52.0)
Hemoglobin: 8.7 g/dL — ABNORMAL LOW (ref 13.0–17.0)
Hemoglobin: 9 g/dL — ABNORMAL LOW (ref 13.0–17.0)
MCHC: 33.4 g/dL (ref 30.0–36.0)
MCHC: 33.7 g/dL (ref 30.0–36.0)
MCHC: 33.7 g/dL (ref 30.0–36.0)
MCV: 92.1 fL (ref 78.0–100.0)
MCV: 92.1 fL (ref 78.0–100.0)
MCV: 92.4 fL (ref 78.0–100.0)
MCV: 93.2 fL (ref 78.0–100.0)
Platelets: 355 10*3/uL (ref 150–400)
RBC: 2.8 MIL/uL — ABNORMAL LOW (ref 4.22–5.81)
RBC: 2.92 MIL/uL — ABNORMAL LOW (ref 4.22–5.81)
RBC: 2.97 MIL/uL — ABNORMAL LOW (ref 4.22–5.81)
WBC: 9.7 10*3/uL (ref 4.0–10.5)

## 2010-07-23 LAB — GLUCOSE, CAPILLARY: Glucose-Capillary: 106 mg/dL — ABNORMAL HIGH (ref 70–99)

## 2010-07-23 LAB — PROTIME-INR: Prothrombin Time: 16.6 seconds — ABNORMAL HIGH (ref 11.6–15.2)

## 2010-08-31 ENCOUNTER — Other Ambulatory Visit: Payer: Self-pay | Admitting: *Deleted

## 2010-08-31 MED ORDER — METOPROLOL TARTRATE 50 MG PO TABS: 75.0000 mg | ORAL_TABLET | Freq: Two times a day (BID) | ORAL | Status: DC

## 2010-09-08 ENCOUNTER — Other Ambulatory Visit: Payer: Self-pay | Admitting: *Deleted

## 2010-09-08 MED ORDER — METOPROLOL TARTRATE 50 MG PO TABS: 25.0000 mg | ORAL_TABLET | Freq: Two times a day (BID) | ORAL | Status: DC

## 2010-09-15 NOTE — Assessment & Plan Note (Signed)
OFFICE VISIT   Larry Castillo, Larry Castillo  DOB:  [birth year]                                        June 30, 2009  CHART #:  [id number]   HISTORY:  The patient comes in today for postoperative follow up.  He is  status post coronary artery bypass grafting x6 on May 30, 2009.  His  postoperative course was complicated by atrial fibrillation for which  she was started on Coumadin and amiodarone.  He was also placed on  Keflex for erythema of his right lower extremity incision.  He was  discharged home on June 08, 2009, in good condition.  He states that  since his discharge he has been progressing well.  He is slowly  increasing his activity and denies any chest pain.  He initially took at  the oxycodone 1-2 times but had significant nausea secondary to the  medication and since that time has been taking Tylenol Castillo.r.n. for pain.  At this point, he is requiring no pain medications at all.  He is  walking regularly.  He denies any shortness of breath or cough or any  lower extremity edema.  He does state that his appetite has remained  somewhat poor but he is eating and has had no nausea or vomiting.  He  has a followup appointment today with Dr. Katrinka Blazing.  His INR has been  followed by [facility] and has been somewhat labile.  At one  point, his INR became as high as 6 and his dose has required frequent  adjustments.  He had it repeated today at the lab here at [facility] and his INR is 2 and his current Coumadin dose is 1 mg daily.  At this point, they are going to release him to the [facility]  for further follow up.   PHYSICAL EXAMINATION:  Blood pressure is 122/77, pulse is 60,  respirations 18, O2 sat 98% on room air.  His sternal and right lower  extremity incisions as well as chest tube sites have all healed well  without erythema or drainage.  Sternum is stable to palpation.  Heart is  regular rate and rhythm without murmurs, rubs,  or gallops.  Lungs:  Breath sounds are diminished about a third of the way up on the left and  are generally clear on the right.  Lower extremities show no clubbing,  cyanosis, or edema.   DIAGNOSTIC TESTS:  Chest x-ray shows increased left pleural effusion  from his previous hospital x-ray.   ASSESSMENT AND PLAN:  The patient is doing well overall status post  coronary artery bypass graft.  He appears to be maintaining sinus rhythm  at this time.  He does have an appointment today to see Dr. Katrinka Blazing who  will reevaluate his medications and hopefully he can come off the  amiodarone soon and possibly the Coumadin.  As far as the pleural  effusion is concerned, he appears to be asymptomatic at this time.  I  have recommended a 14-day course of Lasix 40 mg daily as well as  potassium 20 mEq daily and a 7-day Medrol Dosepak.  Hopefully, we can  avoid a thoracentesis in this patient who is currently on Coumadin and  whose INR has been supratherapeutic.  At this point, he also may  increase his activity  and is released to begin driving.  I recommended  proceeding with cardiac rehab once he is contacted.  We will see him  back in 2 weeks with a followup chest x-ray or sooner if he develops any  symptoms.   Salvatore Decent Dorris Fetch, M.D.  Electronically Signed   GC/MEDQ  D:  06/30/2009  T:  07/01/2009  Job:  [id number]   cc:   Lyn Records, M.D.

## 2010-09-15 NOTE — Assessment & Plan Note (Signed)
OFFICE VISIT   Larry Castillo, Larry Castillo  DOB:  [birth year]                                        July 14, 2009  CHART #:  [id number]   HISTORY:  The patient is status post coronary artery bypass grafting x6  done by Dr. Dorris Fetch on May 30, 2009.  He returns to the office  today for followup of a he left pleural effusion.  The patient was last  seen by Dr. Dorris Fetch on June 30, 2009, where he was noted to have  increase in size in his left pleural effusion.  The patient was noted to  be on Coumadin due to his postoperative atrial fibrillation.  The  patient was noted to be asymptomatic for the end of this office visit.  Dr. Dorris Fetch want to attempt Lasix as well as a Medrol Dosepak prior  to performing thoracentesis.  The patient was scheduled to come back in  2 weeks for followup.  He presents today without complaints.  He is to  go see Dr. Katrinka Blazing today to check his PT/INR blood work.  Most recent lab  work showed INR level was 1.9 last week.  The patient denies any  shortness of breath, chest pain, nausea, vomiting.  He states his  appetite has returned at this point.  He is up ambulating several times  per day without difficulty.   PHYSICAL EXAMINATION:  Vital Signs:  Blood pressure 114/70, pulse of 54,  respirations of 18.  Respiratory:  Decreased breath sounds left base.  Cardiac:  Regular rate and rhythm.  Sternum is stable.  No murmur is  noted.  Abdomen:  Soft, nontender.  Extremities:  No edema noted.  Warm  and well perfused.  Incisions:  Clean, dry, and intact and healing well.   STUDIES:  The patient had a PA and lateral chest x-ray obtained today  showing significant decrease in the patient's left pleural effusion.  There is still small amount of residual left pleural effusion noted.   IMPRESSION AND PLAN:  The patient is progressing well status post  coronary artery bypass graft.  His left pleural effusion has improved  but still small  amount remains.  I gave him another week's worth of  Lasix and potassium and will plan to follow up with Dr. Dorris Fetch in 1  month with a repeat PA and lateral chest x-ray.  Our office will contact  [facility] to arrange the patient to start outpatient  cardiac rehab.  He is to continue following up with Dr.  Katrinka Blazing as directed.  Dr. Katrinka Blazing will continue to follow the patient's  Coumadin level.  If the patient has any questions in the interim, he is  to contact us.  The patient is in agreement.   Salvatore Decent Dorris Fetch, M.D.  Electronically Signed   KMD/MEDQ  D:  07/14/2009  T:  07/15/2009  Job:  [id number]   cc:   Lyn Records, M.D.

## 2010-09-15 NOTE — Assessment & Plan Note (Signed)
OFFICE VISIT   Larry Castillo, Larry Castillo  DOB:  [birth year]                                        August 05, 2009  CHART #:  [id number]   HISTORY:  The patient is a 70 year old gentleman who underwent emergency  coronary bypass grafting x6 on May 30, 2009.  He had three-vessel  disease with non-ST elevation MI.  His ejection fraction was 30%.  Postoperatively, his course was relatively uncomplicated.  He did have  atrial fibrillation and was discharged on Coumadin and amiodarone, but  otherwise there were no significant issues.  He was seen in the office  on June 30, 2009, at which time he had a rather large pleural  effusion.  He was treated with 2 weeks of Lasix as well as 7-day course  of steroids.  He was seen back in the office on July 14, 2009, at which  time his pleural effusion was markedly improved, but there still was  small residual effusion.  He was treated with additional Lasix and now  returns for followup.  He says he had been feeling well.  He does not  have any orthopnea.  He is not having any trouble with his breathing.  He has minimal discomfort.  He is walking twice a day.  He is not having  any swelling in his legs and his incisions are well healed.   CURRENT MEDICATIONS:  1. Amiodarone 200 mg b.i.d.  2. Coumadin 5 mg daily.  3. Enteric-coated aspirin 81 mg daily.  4. Metoprolol 75 mg b.i.d.  5. Crestor 10 mg daily.  6. He also takes fish oil daily and multivitamin.   He is no longer on Lasix or potassium.   PHYSICAL EXAMINATION:  General:  The patient is a well-appearing 68-year-  old gentleman in no acute distress.  Vital Signs:  His blood pressure is  140/84, pulse is 48, respirations are 16, and his oxygen saturation is  96% on room air.  Lungs:  Clear with equal breath sounds bilaterally.  There is no rales or wheezing.  Cardiac:  Bradycardic, but has a regular  rhythm.  There is no rub or murmur.  Extremities:  Without  clubbing,  cyanosis, or edema.  Chest:  Sternum is stable.  His incisions are well  healed.   IMPRESSION:  The patient is a 70 year old gentleman, he is now about 2  months out from emergency coronary bypass grafting.  He is doing well at  this point in time.  He did have a left pleural effusion that has now  resolved with diuretics and steroids.  He does not need specific  followup for that, but obviously would need a chest x-ray if he starts  developing signs or symptoms of a pleural effusion.  We did not make any  medication changes at this time.  There is no longer restrictions on him  in terms of amount of weight he can lift or his physical activities, but  did advise him to build into any new activities gradually and see how he  tolerates it for several days before trying to make significant  increases.  I will be happy to see him back anytime in the future if I  can be of any further assistance with his care.   Salvatore Decent Dorris Fetch, M.D.  Electronically Signed   SCH/MEDQ  D:  08/05/2009  T:  08/06/2009  Job:  [id number]   cc:   Lyn Records, M.D.  Wyvonnia Lora

## 2010-12-01 ENCOUNTER — Telehealth: Payer: Self-pay | Admitting: Cardiology

## 2010-12-01 MED ORDER — ROSUVASTATIN CALCIUM 10 MG PO TABS: 10.0000 mg | ORAL_TABLET | Freq: Every day | ORAL | Status: DC

## 2010-12-01 NOTE — Telephone Encounter (Signed)
.   Requested Prescriptions   Signed Prescriptions Disp Refills  . rosuvastatin (CRESTOR) 10 MG tablet 30 tablet 4    Sig: Take 1 tablet (10 mg total) by mouth at bedtime.    Authorizing Provider: Rollene Rotunda    Ordering User: Lacie Scotts

## 2010-12-20 IMAGING — CR DG CHEST 2V
2 series · 2 of 2 positions shown · non-contrast
Comparison: 06/03/2009.

CLINICAL DATA: CABG.

CHEST - 2 VIEW

[w chest pa]
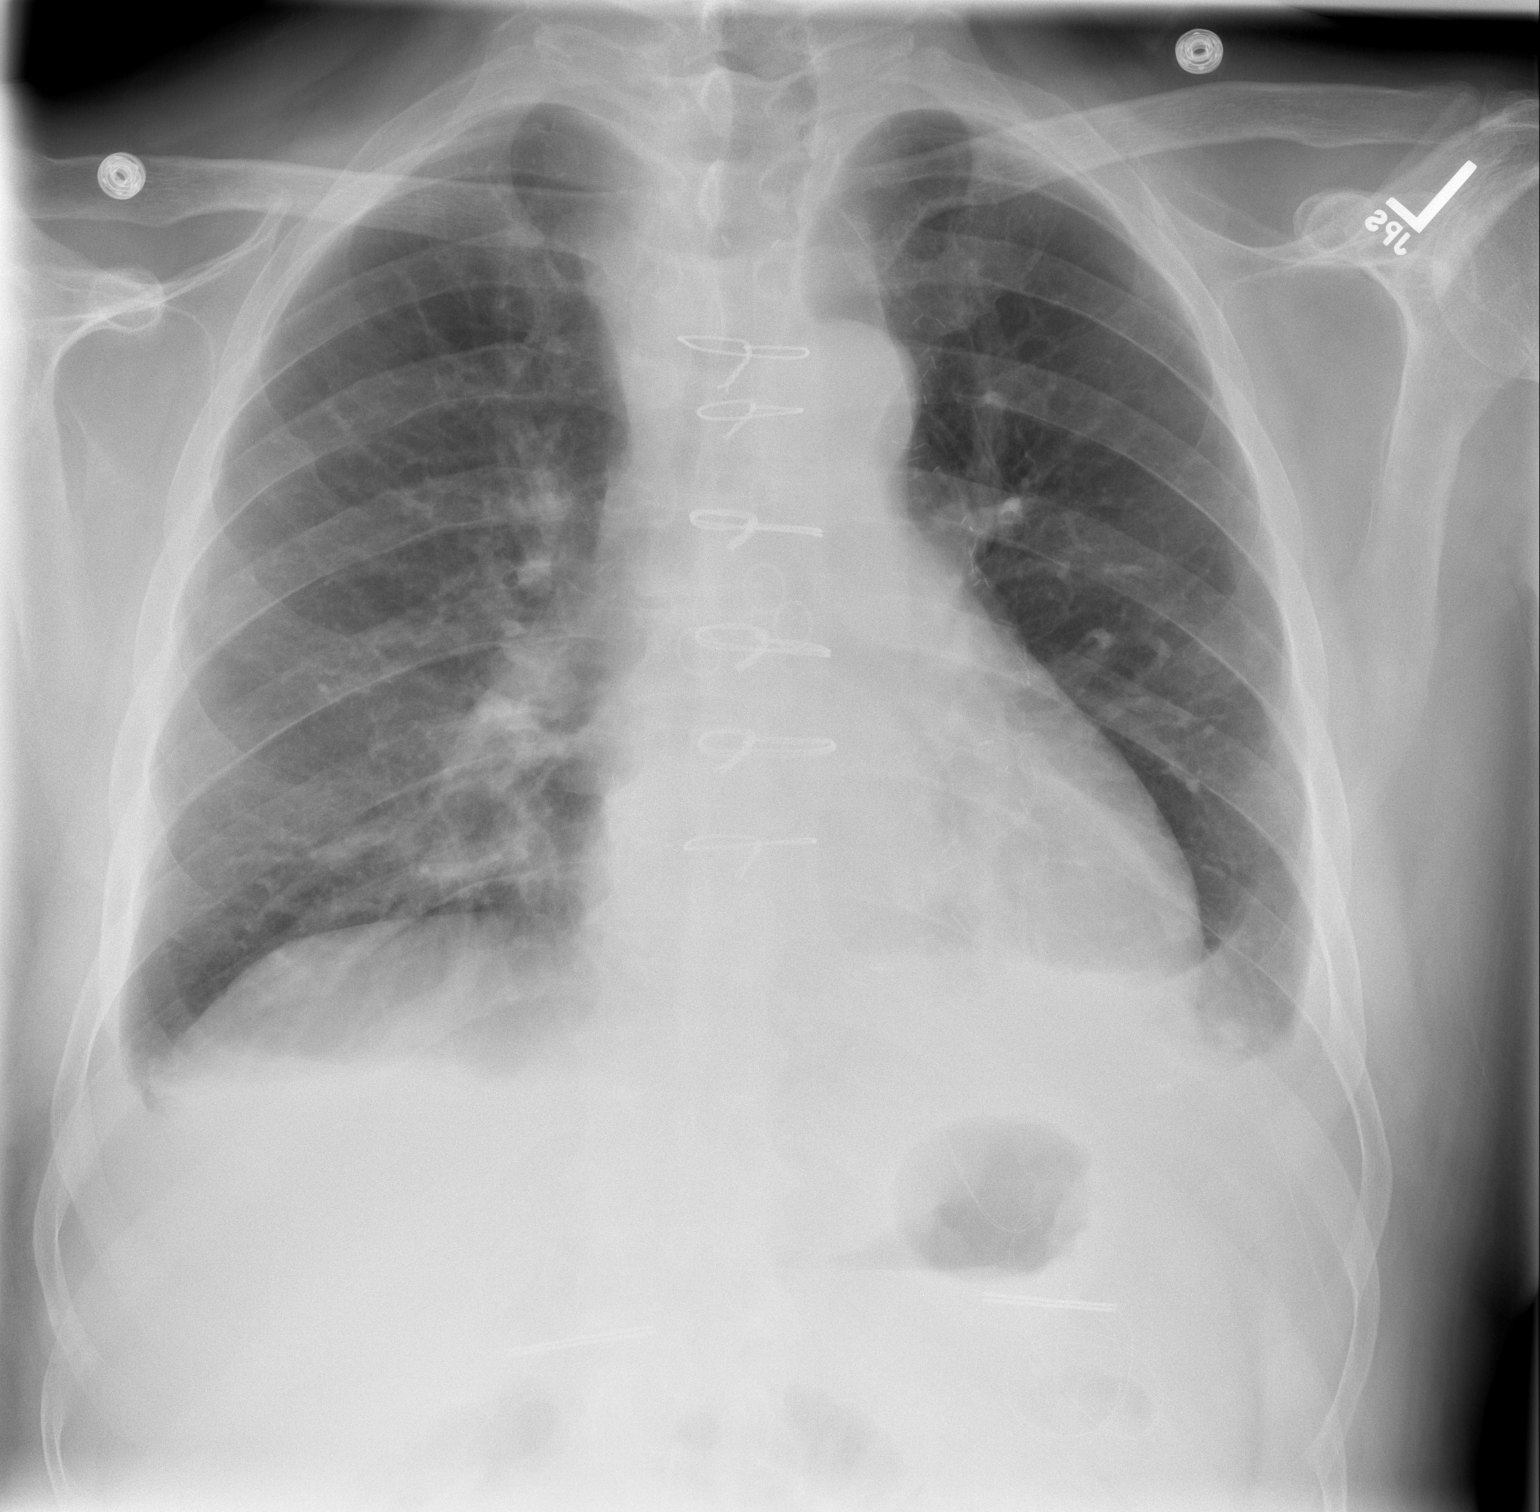

[w chest lat]
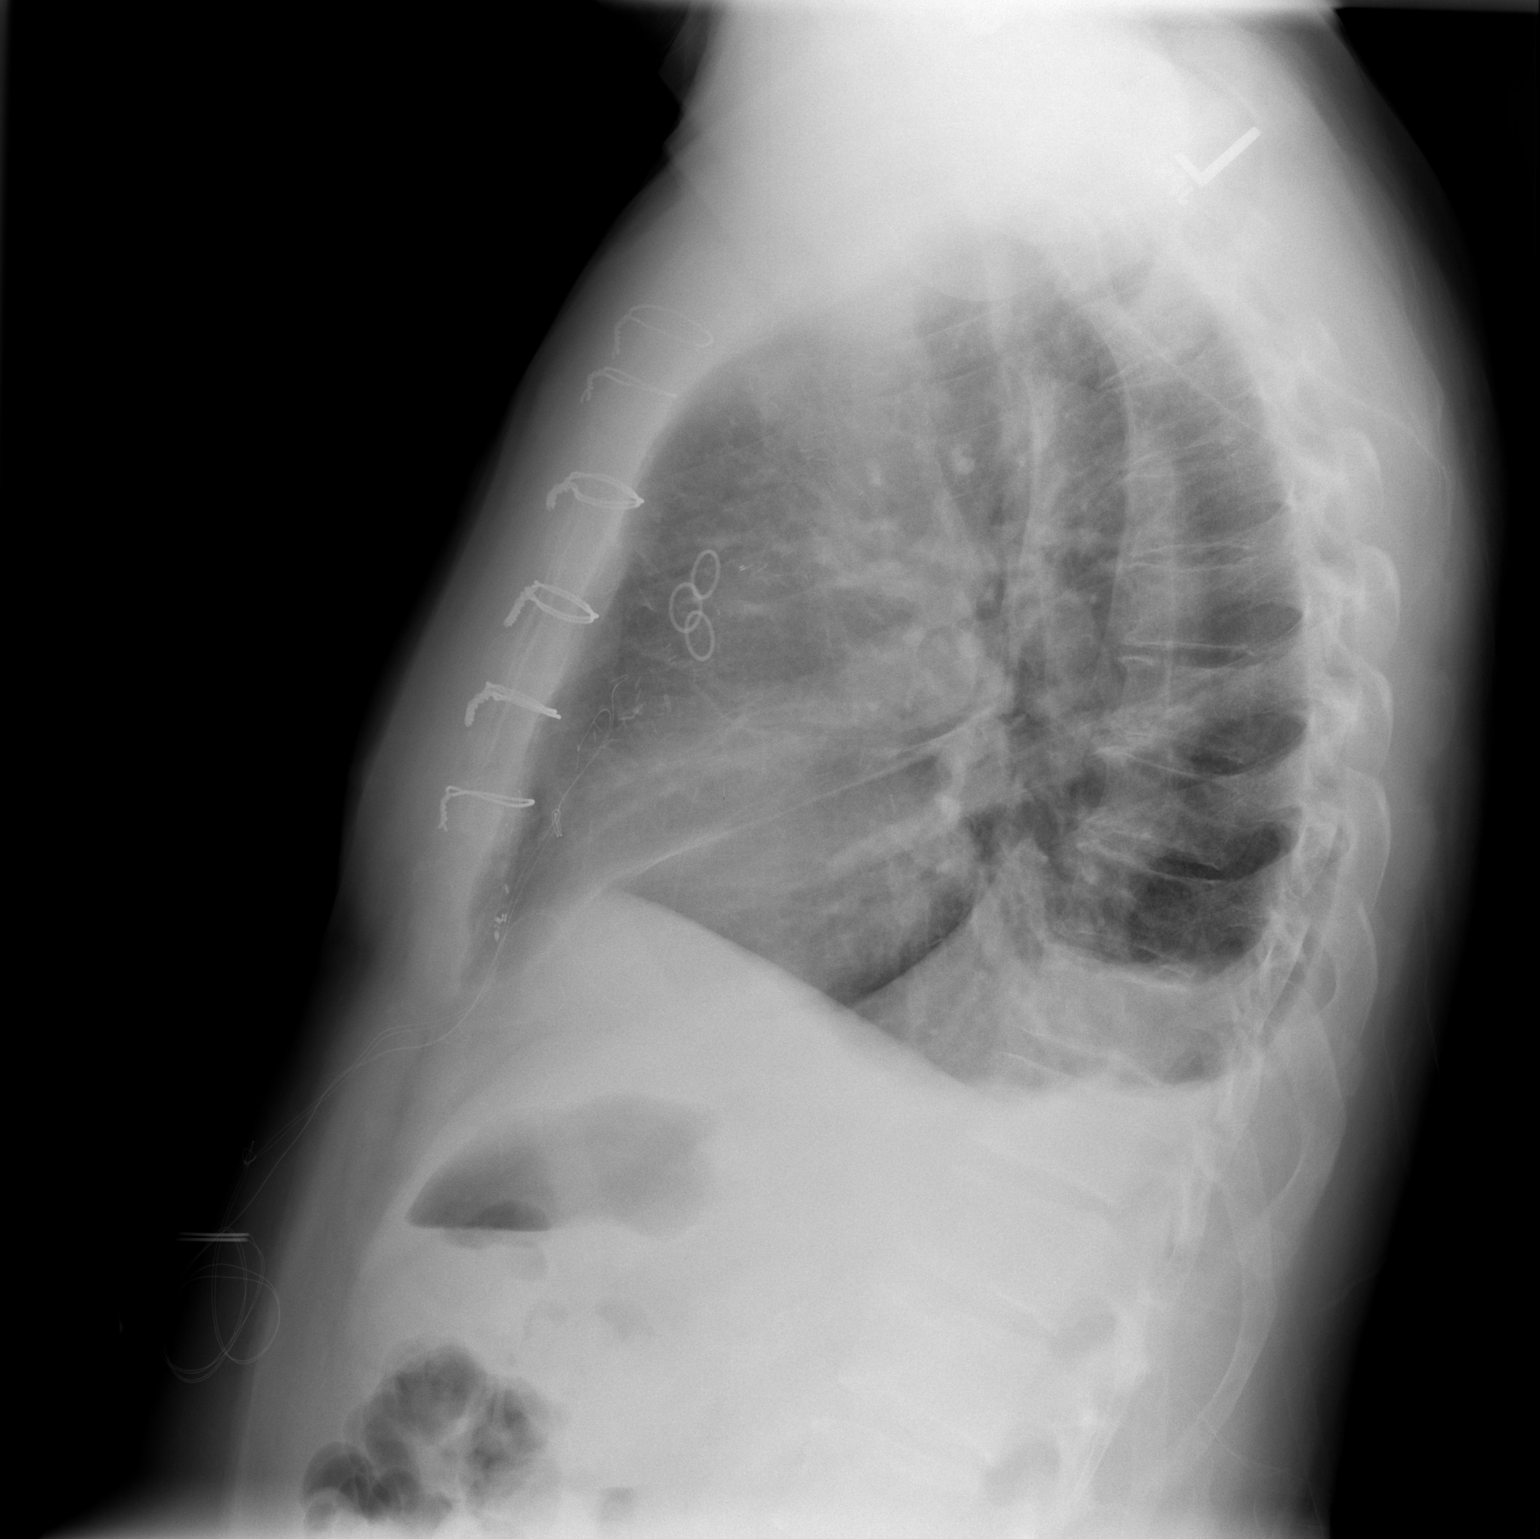

[2 of 2 positions shown; findings below may reference images not displayed]

FINDINGS: Improved aeration in the lung bases.  There remain
pleural effusions bilaterally which have increased in size.  There
is no edema.  Heart size is upper normal.
IMPRESSION: Improvement in bibasilar atelectasis.

Increase in bilateral pleural effusion.

## 2011-01-28 IMAGING — CR DG CHEST 2V
2 series · 2 of 2 positions shown · non-contrast
Comparison: Chest x-ray of 06/30/2009

CLINICAL DATA: History of CABG 6 weeks ago, follow-up

CHEST - 2 VIEW

[view not recorded (1 of 2)]
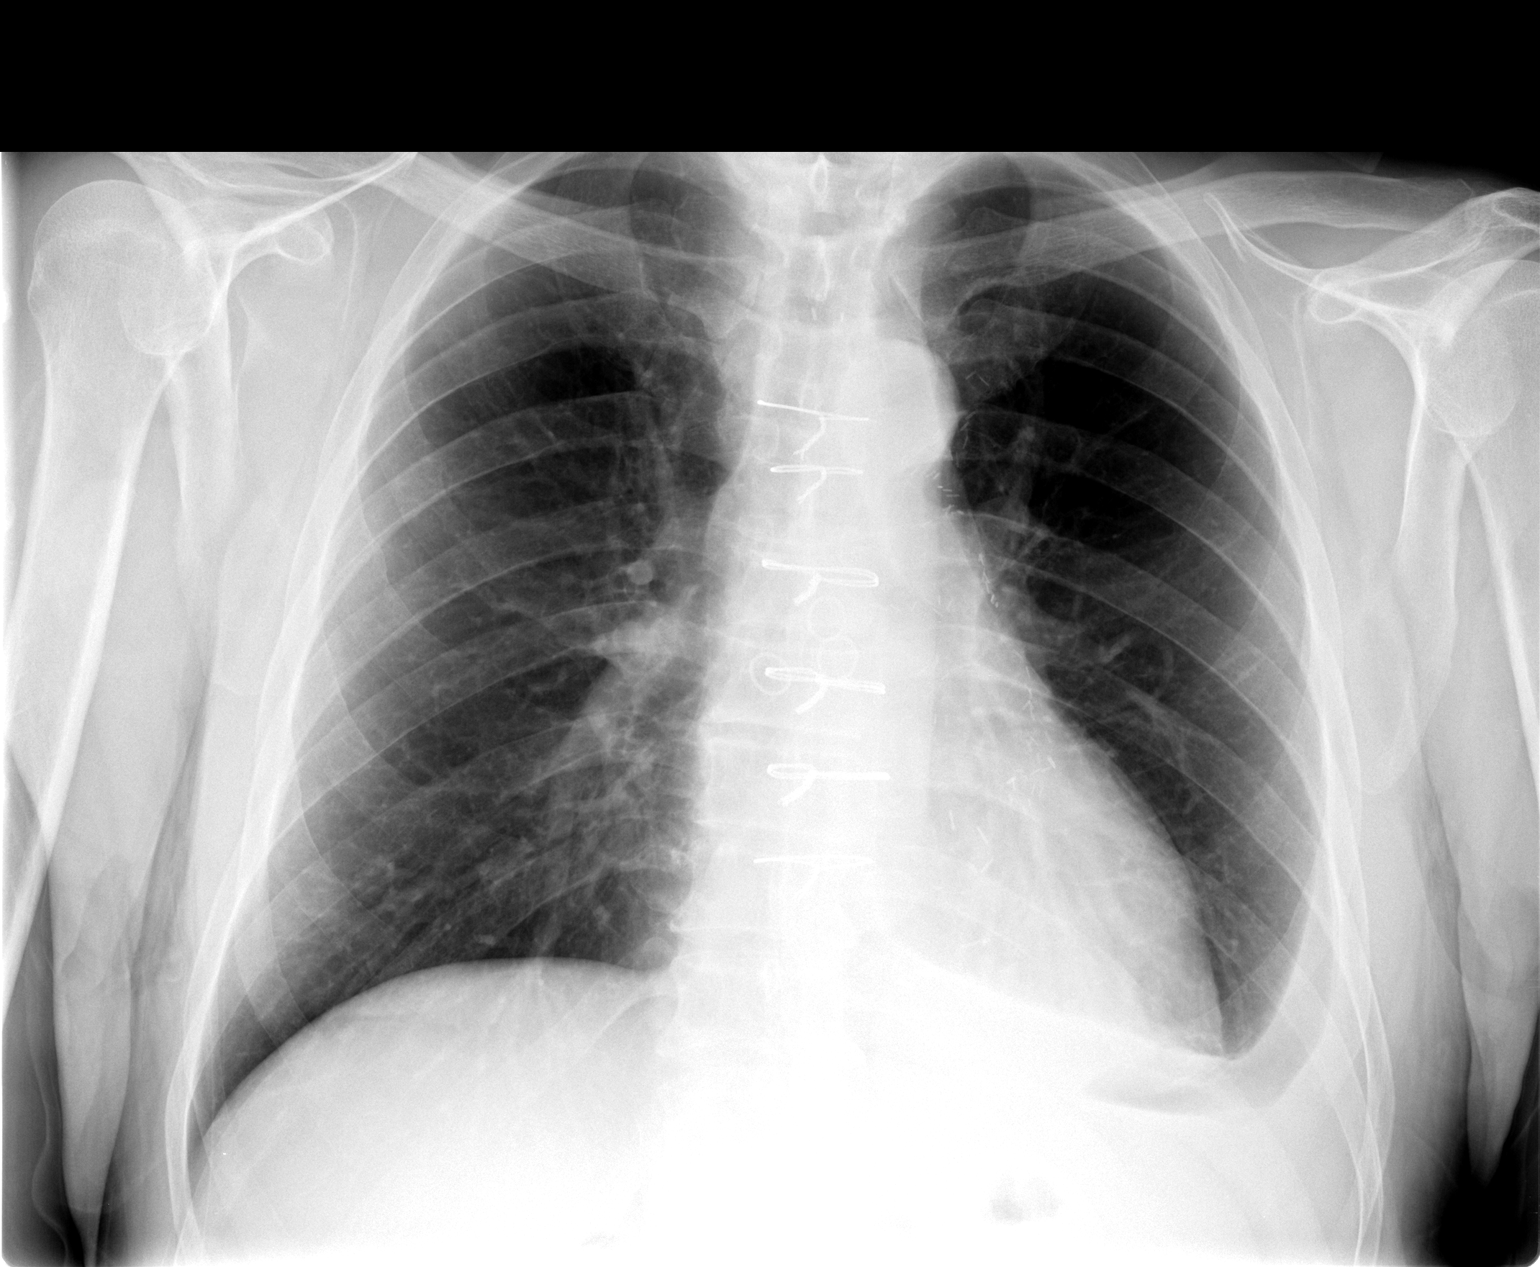

[view not recorded (2 of 2)]
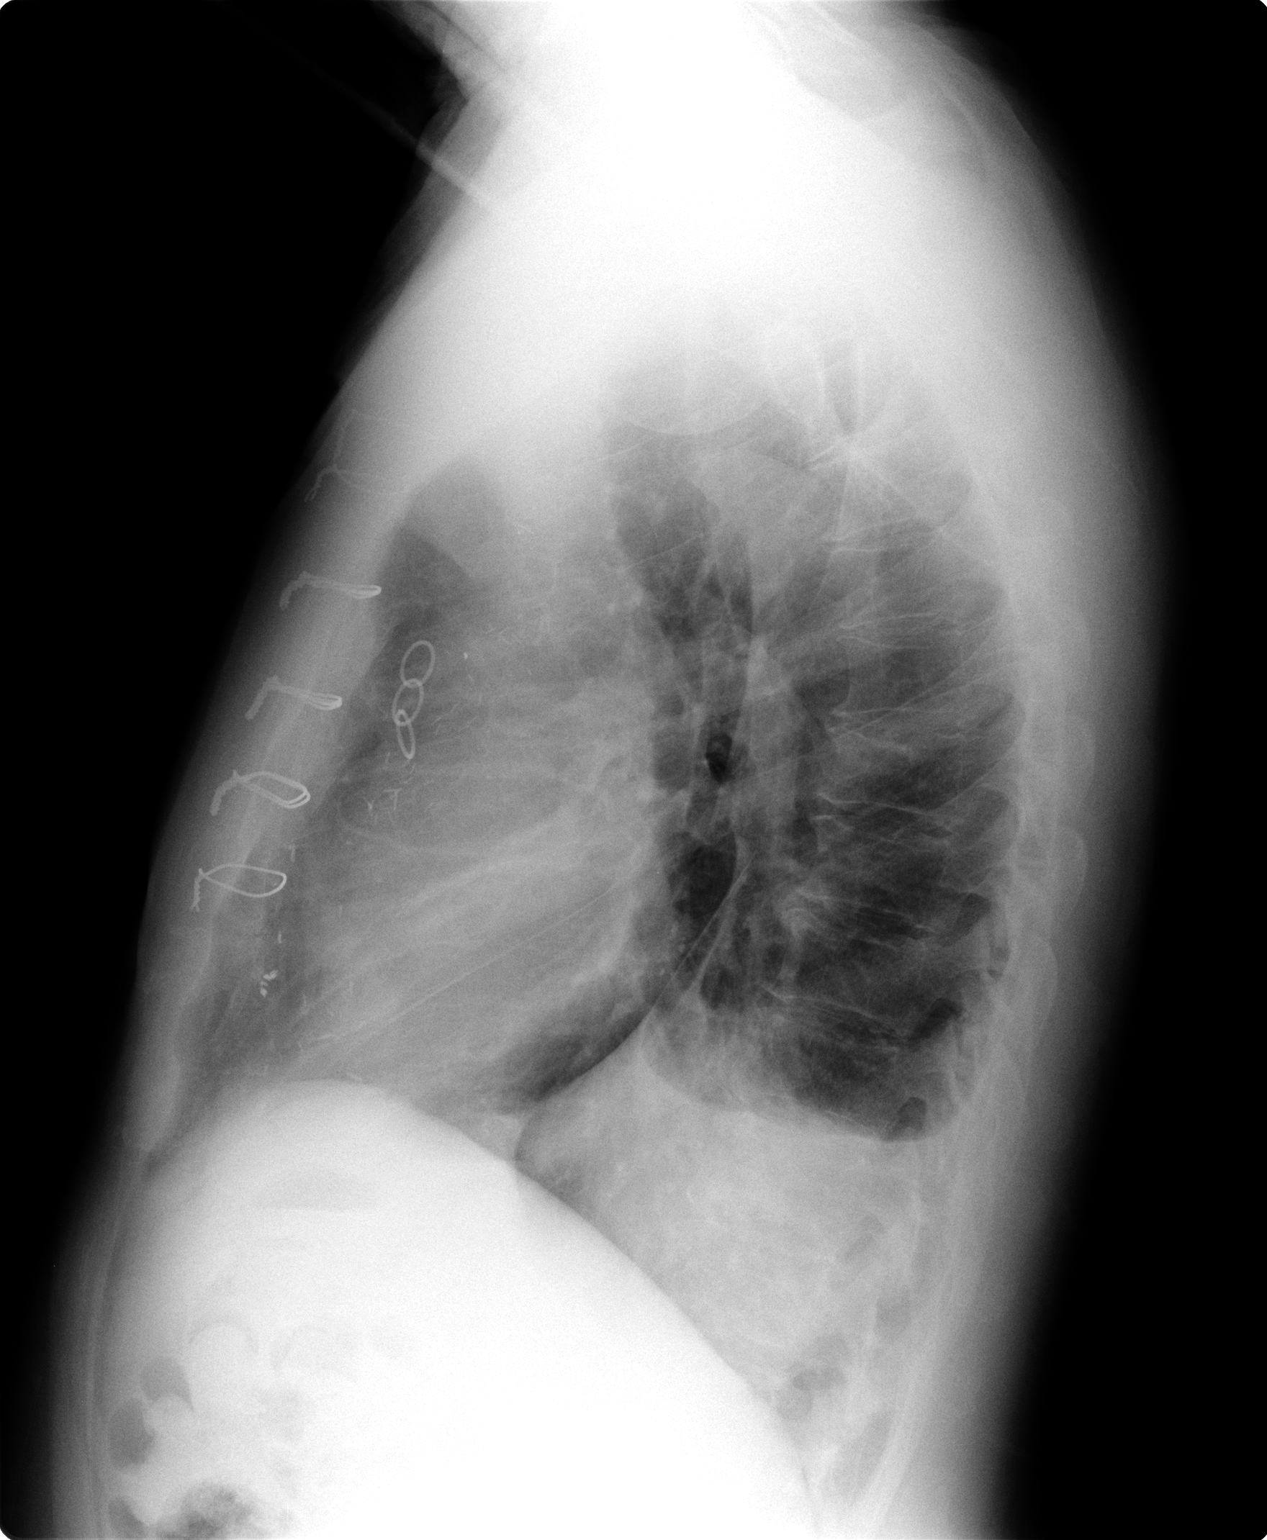

[2 of 2 positions shown; findings below may reference images not displayed]

FINDINGS: There has been a significant decrease in volume of the
left pleural effusion with a small left effusion remaining.  Mild
left basilar atelectasis remains.  The right lung is clear.  Heart
size is stable.  Median sternotomy sutures appear intact.
IMPRESSION: Decrease in volume of left pleural effusion.

## 2011-02-01 ENCOUNTER — Other Ambulatory Visit: Payer: Self-pay | Admitting: Cardiology

## 2011-02-01 ENCOUNTER — Other Ambulatory Visit: Payer: Self-pay | Admitting: *Deleted

## 2011-02-01 MED ORDER — LISINOPRIL 5 MG PO TABS: 5.0000 mg | ORAL_TABLET | Freq: Every day | ORAL | Status: DC

## 2011-02-19 IMAGING — CR DG CHEST 2V
2 series · 2 of 2 positions shown · non-contrast
Comparison: 07/14/2009.

CLINICAL DATA: CABG.

CHEST - 2 VIEW

[view not recorded (1 of 2)]
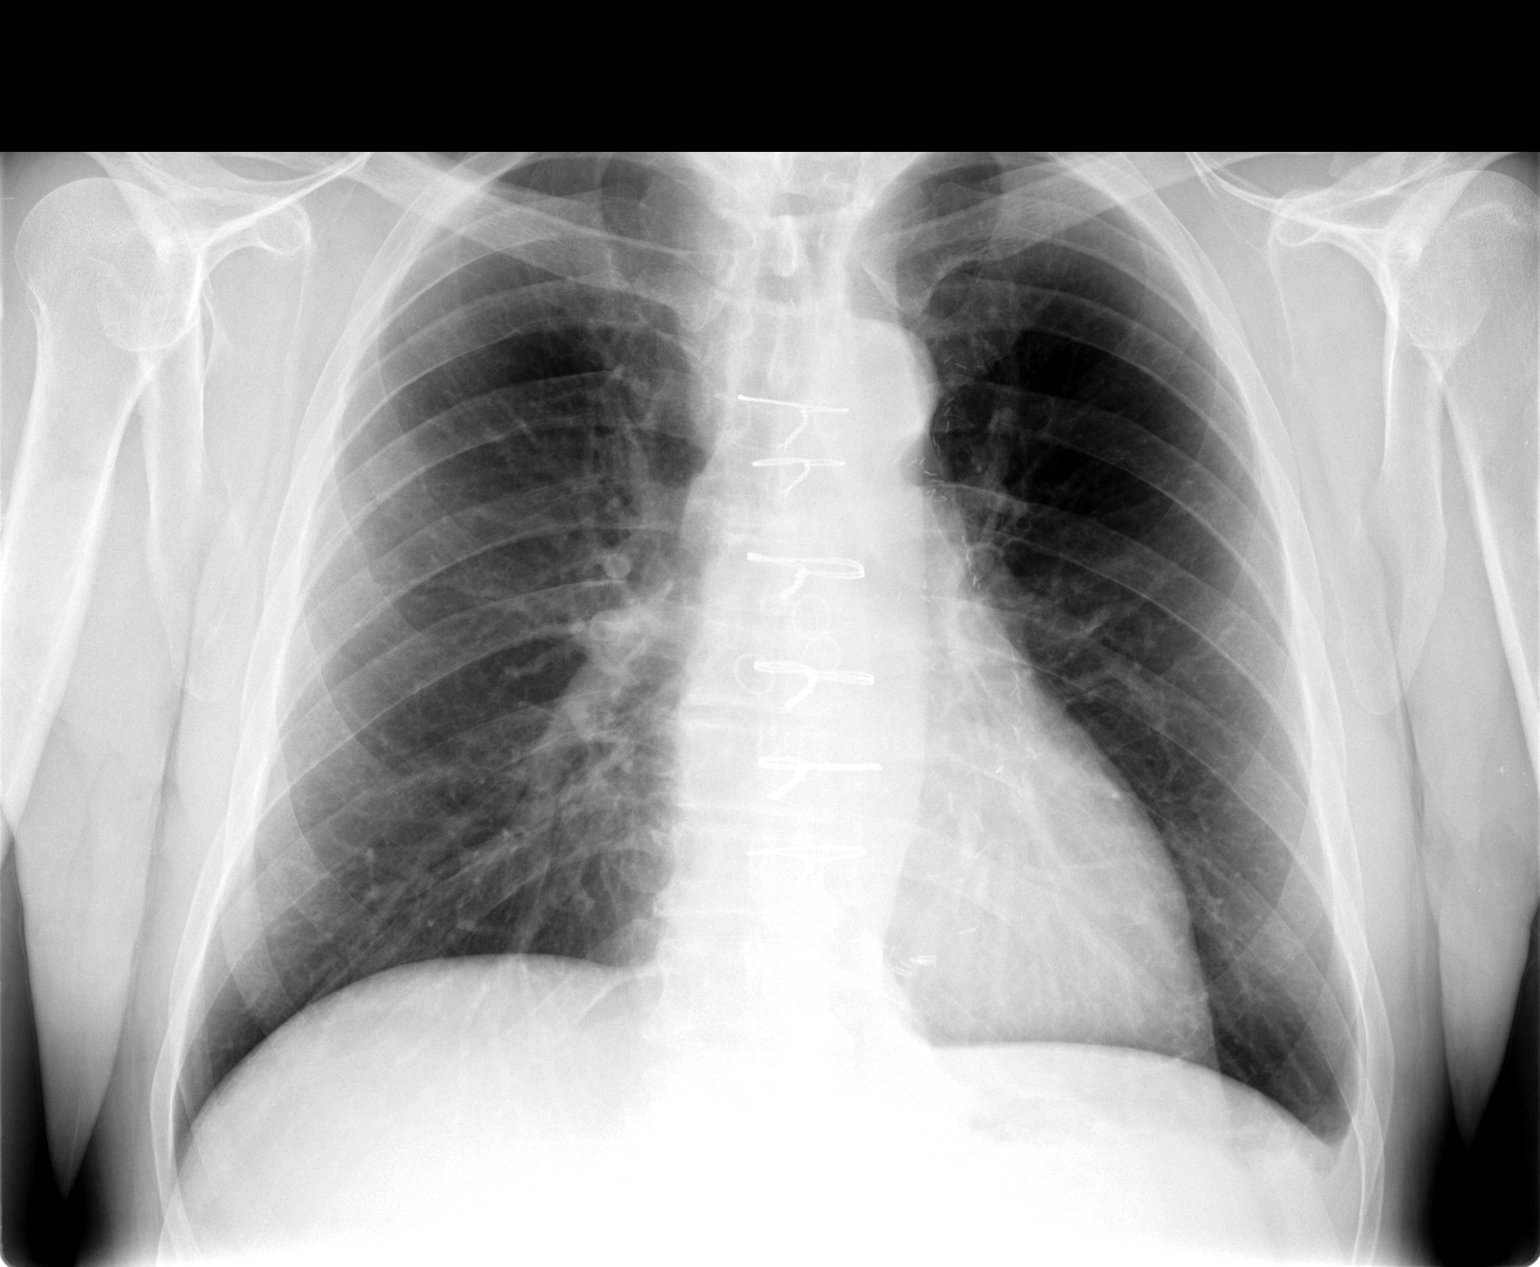

[view not recorded (2 of 2)]
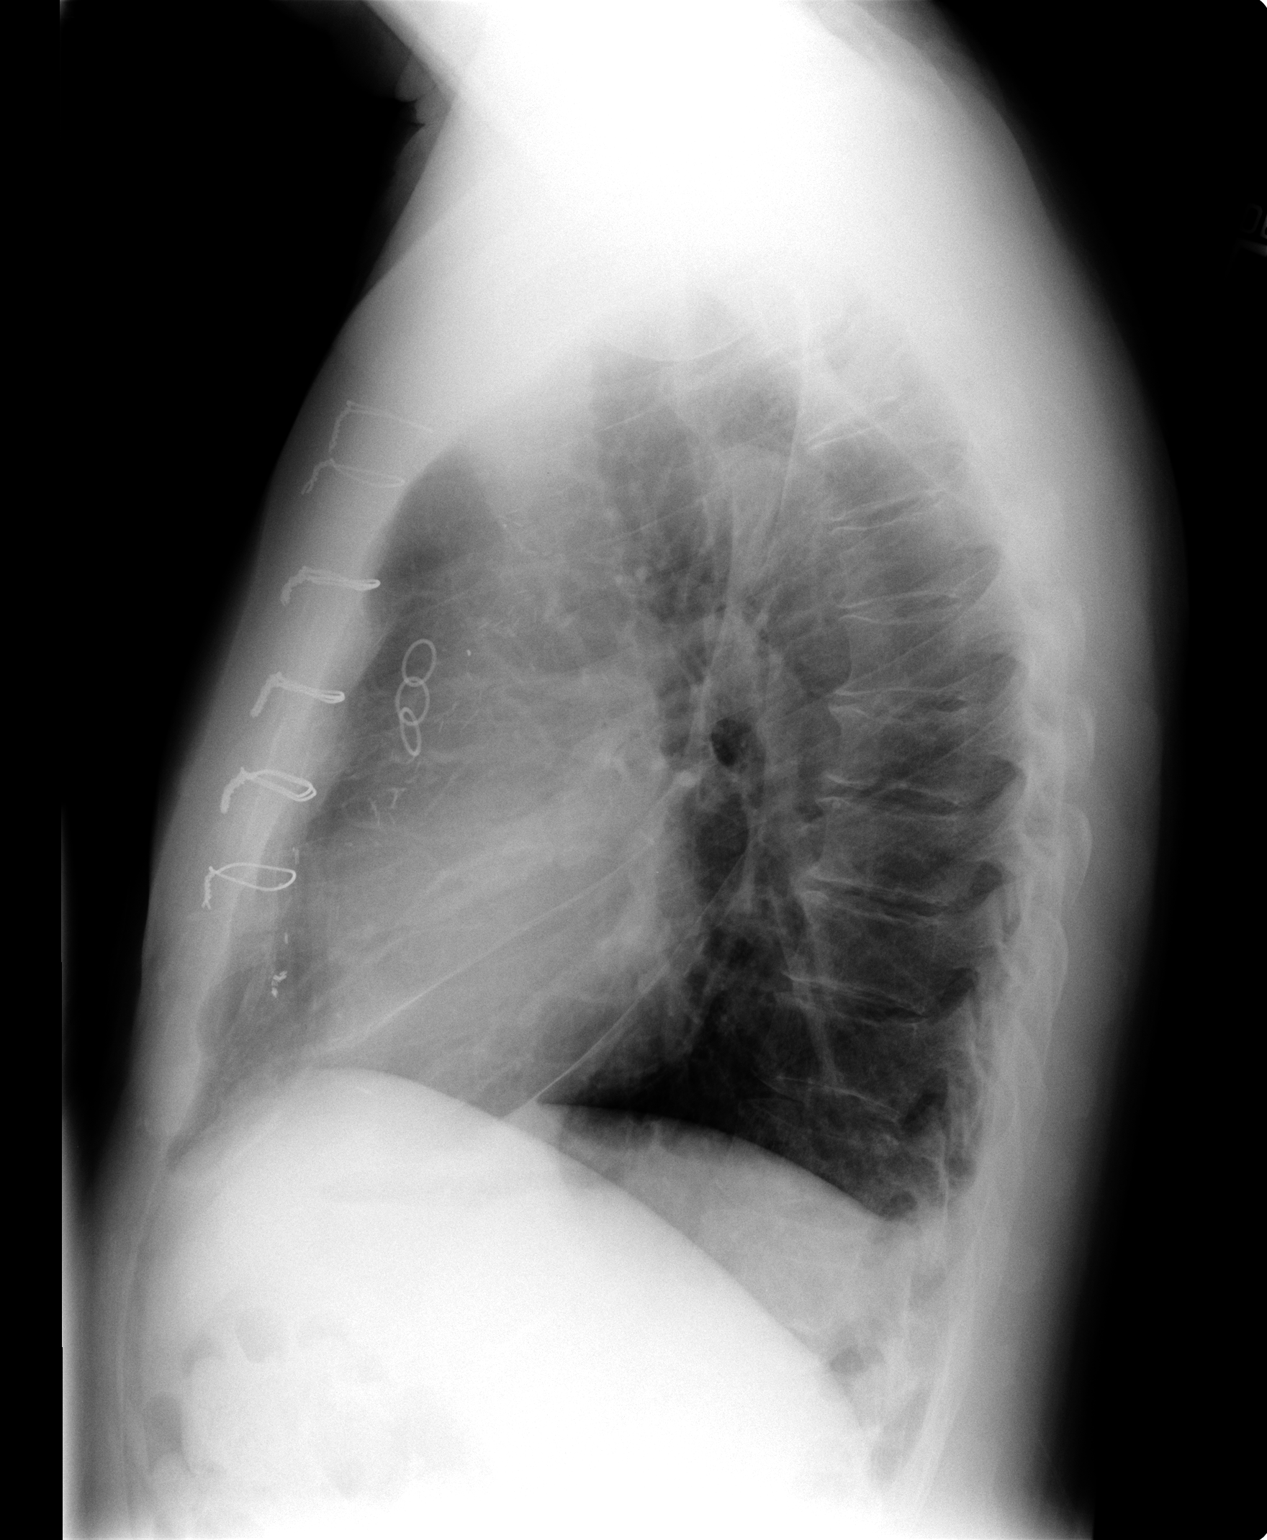

[2 of 2 positions shown; findings below may reference images not displayed]

FINDINGS: The cardiopericardial silhouette is within normal limits.
Mediastinal contours normal.  Postoperative changes of CABG.  Tiny
left pleural effusion remains present along with mild left lower
lobe basilar atelectasis.  Pleural effusion has decreased compared
to prior.  No right pleural effusion.  No pneumothorax.
IMPRESSION: Expected postoperative appearance of the chest status post CABG.
Tiny left pleural effusion.

## 2011-04-09 ENCOUNTER — Encounter: Payer: Self-pay | Admitting: Cardiovascular Disease

## 2011-04-13 ENCOUNTER — Ambulatory Visit: Payer: Self-pay | Admitting: Cardiovascular Disease

## 2011-04-23 ENCOUNTER — Ambulatory Visit: Payer: Self-pay | Admitting: Cardiovascular Disease

## 2011-05-03 ENCOUNTER — Other Ambulatory Visit: Payer: Self-pay | Admitting: *Deleted

## 2011-05-03 MED ORDER — ROSUVASTATIN CALCIUM 10 MG PO TABS: 10.0000 mg | ORAL_TABLET | Freq: Every day | ORAL | Status: DC

## 2011-05-18 ENCOUNTER — Encounter: Payer: Self-pay | Admitting: Cardiovascular Disease

## 2011-05-18 ENCOUNTER — Ambulatory Visit (INDEPENDENT_AMBULATORY_CARE_PROVIDER_SITE_OTHER): Payer: Medicare Other | Admitting: Cardiovascular Disease

## 2011-05-18 VITALS — BP 180/88 | HR 54 | Ht 73.0 in | Wt 211.0 lb

## 2011-05-18 DIAGNOSIS — I10 Essential (primary) hypertension: Secondary | ICD-10-CM | POA: Diagnosis not present

## 2011-05-18 DIAGNOSIS — I2510 Atherosclerotic heart disease of native coronary artery without angina pectoris: Secondary | ICD-10-CM

## 2011-05-18 DIAGNOSIS — Z79899 Other long term (current) drug therapy: Secondary | ICD-10-CM | POA: Diagnosis not present

## 2011-05-18 DIAGNOSIS — E785 Hyperlipidemia, unspecified: Secondary | ICD-10-CM | POA: Diagnosis not present

## 2011-05-18 DIAGNOSIS — I2589 Other forms of chronic ischemic heart disease: Secondary | ICD-10-CM

## 2011-05-18 DIAGNOSIS — I4891 Unspecified atrial fibrillation: Secondary | ICD-10-CM | POA: Diagnosis not present

## 2011-05-18 MED ORDER — LISINOPRIL 10 MG PO TABS: 10.0000 mg | ORAL_TABLET | Freq: Every day | ORAL | Status: DC

## 2011-05-18 NOTE — Assessment & Plan Note (Signed)
His blood pressure is elevated today. His blood pressure manually was 160/88. According the patient his blood pressure has been running in the normal range at home. I will increase lisinopril to 10 mg once daily. I asked him to continue to monitor his blood pressure and bring [place] the readings in one to 2 weeks. We'll check renal functions one week.

## 2011-05-18 NOTE — Assessment & Plan Note (Addendum)
Most recent ejection fraction was 45-50%. Increase the dose of Lisinopril today.

## 2011-05-18 NOTE — Assessment & Plan Note (Signed)
The patient is currently not having any symptoms suggestive of angina. He stays active without exertional symptoms. Continue medical therapy.

## 2011-05-18 NOTE — Patient Instructions (Signed)
Your physician you to follow up in 1 year. You will receive a reminder letter in the mail one-two months in advance. If you don't receive a letter, please call our office to schedule the follow-up appointment. Increase Lisinopril to 10 mg daily. You may take 2 of your 5 mg tablets until gone and then get new prescription filled for 10 mg tablets.  Your physician recommends that you go to the [facility] for lab work: CMET/FLP. Do not eat or drink after midnight. DO LABS IN 1 WEEK. If the results of your test are normal or stable, you will receive a letter. If they are abnormal, the nurse will contact you by phone.

## 2011-05-18 NOTE — Assessment & Plan Note (Signed)
His most recent lipid profile showed an LDL of 97. Target LDL should be less than 70. I will request a followup fasting lipid and liver profile and adjust the dose of Crestor as needed.

## 2011-05-18 NOTE — Assessment & Plan Note (Signed)
This happened postoperatively. He had no events since then. Continue small dose metoprolol and aspirin.

## 2011-05-18 NOTE — Progress Notes (Signed)
HPI  This is a 71 year old man who is here today for a followup visit. He has a history of ischemic heart disease status post coronary artery bypass graft surgery in 2011. His ejection fraction was low at that time but subsequently after surgery his EF was 45-50%. He also had postoperative atrial fibrillation but has maintained in sinus rhythm since then. Overall, he has been doing reasonably well. The dose of metoprolol was gradually decreased to 25 mg twice daily due to bradycardia. The patient denies any chest pain or dyspnea. He exercises on a daily basis by walking at least 2 miles. His blood pressure at home has been running under reasonable control according to the patient with systolic blood pressure less than 140.  No Known Allergies   Current Outpatient Prescriptions on File Prior to Visit  Medication Sig Dispense Refill  . aspirin 81 MG tablet Take 81 mg by mouth daily.        . fish oil-omega-3 fatty acids 1000 MG capsule Take 1 capsule by mouth daily.        Marland Kitchen lisinopril (PRINIVIL,ZESTRIL) 5 MG tablet TAKE ONE TABLET BY MOUTH DAILY EMERGENCY REFILL FAXED DR  30 tablet  6  . metoprolol (LOPRESSOR) 50 MG tablet Take 0.5 tablets (25 mg total) by mouth 2 (two) times daily.  60 tablet  11  . Multiple Vitamin (MULTIVITAMIN) tablet Take 1 tablet by mouth daily.        . rosuvastatin (CRESTOR) 10 MG tablet Take 1 tablet (10 mg total) by mouth at bedtime.  30 tablet  1     Past Medical History  Diagnosis Date  . Bleeding ulcer   . Rectal abscess   . Recurrent sinus infections   . Anemia     Operative  . Cardiomyopathy   . CAD (coronary artery disease)   . Atrial fibrillation     post operative  . Hypertension   . Hyperlipidemia      Past Surgical History  Procedure Date  . Coronary artery bypass graft 05/30/2009     Family History  Problem Relation Age of Onset  . Liver disease Mother     Failure  . Heart attack Father   . Suicidality Brother   . Cancer Brother       History   Social History  . Marital Status: Married    Spouse Name: N/A    Number of Children: N/A  . Years of Education: N/A   Occupational History  . Retired Music therapist    Social History Main Topics  . Smoking status: Never Smoker   . Smokeless tobacco: Never Used  . Alcohol Use: No  . Drug Use: Not on file  . Sexually Active: Not on file   Other Topics Concern  . Not on file   Social History Narrative   Married Adult children     PHYSICAL EXAM   BP 180/88  Pulse 54  Ht 6\' 1"  (1.854 m)  Wt 211 lb (95.709 kg)  BMI 27.84 kg/m2 Constitutional: He is oriented to person, place, and time. He appears well-developed and well-nourished. No distress.  HENT: No nasal discharge.  Head: Normocephalic and atraumatic.  Eyes: Pupils are equal, round, and reactive to light. Right eye exhibits no discharge. Left eye exhibits no discharge.  Neck: Normal range of motion. Neck supple. No JVD present. No thyromegaly present.  Cardiovascular: Normal rate, regular rhythm, normal heart sounds and intact distal pulses. Exam reveals no gallop and no friction rub.  No murmur heard.  Pulmonary/Chest: Effort normal and breath sounds normal. No stridor. No respiratory distress. He has no wheezes. He has no rales. He exhibits no tenderness.  Abdominal: Soft. Bowel sounds are normal. He exhibits no distension. There is no tenderness. There is no rebound and no guarding.  Musculoskeletal: Normal range of motion. He exhibits no edema and no tenderness.  Neurological: He is alert and oriented to person, place, and time. Coordination normal.  Skin: Skin is warm and dry. No rash noted. He is not diaphoretic. No erythema. No pallor.  Psychiatric: He has a normal mood and affect. His behavior is normal. Judgment and thought content normal.       EKG: Normal sinus rhythm with a heart rate of 55 beats per minute. No significant ST or T wave changes.   ASSESSMENT AND PLAN

## 2011-05-26 DIAGNOSIS — E785 Hyperlipidemia, unspecified: Secondary | ICD-10-CM | POA: Diagnosis not present

## 2011-05-26 DIAGNOSIS — I10 Essential (primary) hypertension: Secondary | ICD-10-CM | POA: Diagnosis not present

## 2011-05-26 DIAGNOSIS — Z79899 Other long term (current) drug therapy: Secondary | ICD-10-CM | POA: Diagnosis not present

## 2011-05-31 ENCOUNTER — Encounter: Payer: Self-pay | Admitting: *Deleted

## 2011-07-31 ENCOUNTER — Other Ambulatory Visit: Payer: Self-pay | Admitting: Cardiology

## 2011-10-08 ENCOUNTER — Other Ambulatory Visit: Payer: Self-pay | Admitting: Cardiology

## 2012-01-18 ENCOUNTER — Other Ambulatory Visit: Payer: Self-pay | Admitting: Cardiology

## 2012-01-18 MED ORDER — LISINOPRIL 10 MG PO TABS: 10.0000 mg | ORAL_TABLET | Freq: Every day | ORAL | Status: DC

## 2012-03-01 ENCOUNTER — Other Ambulatory Visit: Payer: Self-pay | Admitting: Cardiovascular Disease

## 2012-03-01 ENCOUNTER — Other Ambulatory Visit: Payer: Self-pay | Admitting: Cardiology

## 2012-03-01 MED ORDER — ROSUVASTATIN CALCIUM 10 MG PO TABS: 10.0000 mg | ORAL_TABLET | Freq: Every day | ORAL | Status: DC

## 2012-04-12 ENCOUNTER — Telehealth: Payer: Self-pay | Admitting: Physician Assistant

## 2012-04-12 DIAGNOSIS — I10 Essential (primary) hypertension: Secondary | ICD-10-CM

## 2012-04-12 NOTE — Telephone Encounter (Signed)
BP staying elevated x last few weeks.  No other stress issues involved.  Did check with pharmacist & was told that his last refill did come from a different manufacturer.  Currently taking Lisinopril 10mg  every morning & Metooprolol 50mg  - 1/2 tab twice a day.  Has upcoming OV to see Arida on 05/30/2012.

## 2012-04-12 NOTE — Telephone Encounter (Signed)
Increase Lisinopril to 20 mg once daily. Check BMP 1 week after increasing dose.

## 2012-04-12 NOTE — Telephone Encounter (Signed)
PATIENTS BP has been running since he has had this medication refilled.  160/95 @ 11am    Retook @ 3pm after taking another 10mg  pill it was 180/95. Retook at 7pm  174/85  Today at 7am was 164/84 8am  150/85   Lisinopril 10MG    Filled at [facility]  Has not changed anything as far as diet,exercise, etc...Marland KitchenMarland Kitchen

## 2012-04-14 MED ORDER — LISINOPRIL 20 MG PO TABS: 20.0000 mg | ORAL_TABLET | Freq: Every day | ORAL | Status: DC

## 2012-04-14 NOTE — Telephone Encounter (Signed)
Patient informed and lab order faxed to [facility] lab. 

## 2012-04-14 NOTE — Telephone Encounter (Signed)
Left message for patient to call office.  

## 2012-04-21 DIAGNOSIS — I10 Essential (primary) hypertension: Secondary | ICD-10-CM | POA: Diagnosis not present

## 2012-04-27 ENCOUNTER — Telehealth: Payer: Self-pay | Admitting: Cardiology

## 2012-04-27 NOTE — Telephone Encounter (Signed)
Pt notified normal labs

## 2012-04-27 NOTE — Telephone Encounter (Signed)
Left message for pt to call back at [phone]

## 2012-04-27 NOTE — Telephone Encounter (Signed)
Message copied by Burnice Logan on Thu Apr 27, 2012  3:40 PM ------      Message from: Larry Castillo      Created: Wed Apr 26, 2012  9:49 AM       Inform patient that labs were fine. Renal function is stable.

## 2012-05-30 ENCOUNTER — Ambulatory Visit (INDEPENDENT_AMBULATORY_CARE_PROVIDER_SITE_OTHER): Payer: Medicare Other | Admitting: Cardiovascular Disease

## 2012-05-30 ENCOUNTER — Encounter: Payer: Self-pay | Admitting: Cardiovascular Disease

## 2012-05-30 VITALS — BP 156/74 | HR 48 | Ht 73.0 in | Wt 226.0 lb

## 2012-05-30 DIAGNOSIS — I2589 Other forms of chronic ischemic heart disease: Secondary | ICD-10-CM

## 2012-05-30 DIAGNOSIS — I4891 Unspecified atrial fibrillation: Secondary | ICD-10-CM

## 2012-05-30 DIAGNOSIS — I10 Essential (primary) hypertension: Secondary | ICD-10-CM | POA: Diagnosis not present

## 2012-05-30 DIAGNOSIS — E785 Hyperlipidemia, unspecified: Secondary | ICD-10-CM

## 2012-05-30 DIAGNOSIS — I2510 Atherosclerotic heart disease of native coronary artery without angina pectoris: Secondary | ICD-10-CM

## 2012-05-30 MED ORDER — CARVEDILOL 3.125 MG PO TABS: 3.1250 mg | ORAL_TABLET | Freq: Two times a day (BID) | ORAL | Status: DC

## 2012-05-30 MED ORDER — LISINOPRIL 40 MG PO TABS: 40.0000 mg | ORAL_TABLET | Freq: Every day | ORAL | Status: DC

## 2012-05-30 NOTE — Assessment & Plan Note (Signed)
He has no symptoms suggestive of angina. Continue medical therapy. 

## 2012-05-30 NOTE — Assessment & Plan Note (Signed)
Ejection fraction was only mildly reduced on most recent evaluation. Continue treatment with a beta blocker and an ACE inhibitor. Consider a followup echocardiogram in one to 2 years.

## 2012-05-30 NOTE — Assessment & Plan Note (Signed)
Continue treatment with rosuvastatin. Most recent lipid profile last year showed an LDL of 72 and a triglyceride of 65 with an HDL of 56.

## 2012-05-30 NOTE — Assessment & Plan Note (Signed)
His blood pressure improved but still not at target. He is also more bradycardic with metoprolol. Thus, I will go ahead and switch metoprolol to carvedilol 3.125 mg twice daily and increase lisinopril to 40 mg once daily. I asked him to call us in 2 weeks to update [place] about the heart rate and blood pressure. If he continues to be bradycardic with suboptimal blood pressure control, I recommend adding amlodipine.

## 2012-05-30 NOTE — Progress Notes (Signed)
HPI  This is a 72 year old man who is here today for a followup visit. He has a history of ischemic heart disease status post coronary artery bypass graft surgery in 2011. His ejection fraction was low at that time but subsequently after surgery his EF was 45-50%. He also had postoperative atrial fibrillation but has maintained in sinus rhythm since then. Overall, he has been doing reasonably well. The dose of metoprolol was gradually decreased to 25 mg twice daily due to bradycardia. The patient denies any chest pain or dyspnea. He is not exercising now on a regular basis due to the cold weather. He gained some weight. His blood pressure has been going up. He called [place] recently with high blood pressure. We increase his lisinopril to 20 mg once daily. Blood pressure improved but still not at target.    No Known Allergies   Current Outpatient Prescriptions on File Prior to Visit  Medication Sig Dispense Refill  . aspirin 81 MG tablet Take 81 mg by mouth daily.        . fish oil-omega-3 fatty acids 1000 MG capsule Take 1 capsule by mouth daily.        . Multiple Vitamin (MULTIVITAMIN) tablet Take 1 tablet by mouth daily.        . rosuvastatin (CRESTOR) 10 MG tablet Take 1 tablet (10 mg total) by mouth daily.  30 tablet  6  . carvedilol (COREG) 3.125 MG tablet Take 1 tablet (3.125 mg total) by mouth 2 (two) times daily.  60 tablet  6     Past Medical History  Diagnosis Date  . Bleeding ulcer   . Rectal abscess   . Recurrent sinus infections   . Anemia     Operative  . Cardiomyopathy   . CAD (coronary artery disease)   . Atrial fibrillation     post operative  . Hypertension   . Hyperlipidemia      Past Surgical History  Procedure Date  . Coronary artery bypass graft 05/30/2009     Family History  Problem Relation Age of Onset  . Liver disease Mother     Failure  . Heart attack Father   . Suicidality Brother   . Cancer Brother      History   Social History  .  Marital Status: Married    Spouse Name: N/A    Number of Children: N/A  . Years of Education: N/A   Occupational History  . Retired Music therapist    Social History Main Topics  . Smoking status: Never Smoker   . Smokeless tobacco: Never Used  . Alcohol Use: No  . Drug Use: Not on file  . Sexually Active: Not on file   Other Topics Concern  . Not on file   Social History Narrative   Married Adult children     PHYSICAL EXAM   BP 156/74  Pulse 48  Ht 6\' 1"  (1.854 m)  Wt 226 lb (102.513 kg)  BMI 29.82 kg/m2 Constitutional: He is oriented to person, place, and time. He appears well-developed and well-nourished. No distress.  HENT: No nasal discharge.  Head: Normocephalic and atraumatic.  Eyes: Pupils are equal, round, and reactive to light. Right eye exhibits no discharge. Left eye exhibits no discharge.  Neck: Normal range of motion. Neck supple. No JVD present. No thyromegaly present.  Cardiovascular: Normal rate, regular rhythm, normal heart sounds and intact distal pulses. Exam reveals no gallop and no friction rub.  No murmur  heard.  Pulmonary/Chest: Effort normal and breath sounds normal. No stridor. No respiratory distress. He has no wheezes. He has no rales. He exhibits no tenderness.  Abdominal: Soft. Bowel sounds are normal. He exhibits no distension. There is no tenderness. There is no rebound and no guarding.  Musculoskeletal: Normal range of motion. He exhibits no edema and no tenderness.  Neurological: He is alert and oriented to person, place, and time. Coordination normal.  Skin: Skin is warm and dry. No rash noted. He is not diaphoretic. No erythema. No pallor.  Psychiatric: He has a normal mood and affect. His behavior is normal. Judgment and thought content normal.       EKG: Sinus  Bradycardia  -With sinus arrhythmia  -Old anteroseptal infarct.   -  Nonspecific T-abnormality.   ABNORMAL    ASSESSMENT AND PLAN

## 2012-05-30 NOTE — Patient Instructions (Addendum)
Stop Metoprolol Start Carvedilol (Coreg) 3.125 mg twice daily.  Increase Lisinopril to 40 mg once daily.  Call us in 2 weeks to update [place] about blood pressure and heart rate.  Follow up in 1 year.

## 2012-06-14 DIAGNOSIS — H353290 Exudative age-related macular degeneration, unspecified eye, stage unspecified: Secondary | ICD-10-CM | POA: Diagnosis not present

## 2012-07-04 ENCOUNTER — Encounter (INDEPENDENT_AMBULATORY_CARE_PROVIDER_SITE_OTHER): Payer: Medicare Other | Admitting: Ophthalmology

## 2012-07-18 ENCOUNTER — Encounter (INDEPENDENT_AMBULATORY_CARE_PROVIDER_SITE_OTHER): Payer: Medicare Other | Admitting: Ophthalmology

## 2012-09-28 ENCOUNTER — Other Ambulatory Visit: Payer: Self-pay | Admitting: Cardiovascular Disease

## 2012-12-26 ENCOUNTER — Telehealth: Payer: Self-pay | Admitting: Cardiovascular Disease

## 2012-12-26 NOTE — Telephone Encounter (Signed)
States that he has been having blurred vision since January.  States started happening a couple weeks after he started new med.  Stated he knew he should have called before now, but wanted to make sure not eye related.  Has been to 2 different eye MD's & have ruled out any problems.  He is requesting that he be able to stop the Carvedilol and go back to the Metoprolol.  Explained to him why med was changed in relationship to his low heart rate.  States his BP has been running 101 - 140's / 80 - 85 & HR running in the 60's.

## 2012-12-26 NOTE — Telephone Encounter (Signed)
Patient notified and verbalized understanding. 

## 2012-12-26 NOTE — Telephone Encounter (Signed)
Left message with wife to return call.

## 2012-12-26 NOTE — Telephone Encounter (Signed)
Taking Carvedilol. It replaced metroprolol.  Said that the Carvedilol is giving him blurred vision. Would like to go back on Metroprolol

## 2012-12-26 NOTE — Telephone Encounter (Signed)
Hold Carvedilol and see if symptoms improve. Continue to monitor BP.

## 2012-12-29 ENCOUNTER — Other Ambulatory Visit: Payer: Self-pay | Admitting: Cardiovascular Disease

## 2013-05-01 ENCOUNTER — Other Ambulatory Visit: Payer: Self-pay | Admitting: *Deleted

## 2013-05-01 ENCOUNTER — Other Ambulatory Visit: Payer: Self-pay | Admitting: Cardiovascular Disease

## 2013-05-01 MED ORDER — ROSUVASTATIN CALCIUM 10 MG PO TABS: ORAL_TABLET | ORAL | Status: DC

## 2013-05-01 NOTE — Telephone Encounter (Signed)
Requested Prescriptions   Signed Prescriptions Disp Refills  . rosuvastatin (CRESTOR) 10 MG tablet 30 tablet 6    Sig: TAKE 1 TABLET BY MOUTH AT BEDTIME    Authorizing Provider: Lorine Bears A    Ordering User: Kendrick Fries

## 2013-05-22 ENCOUNTER — Ambulatory Visit (INDEPENDENT_AMBULATORY_CARE_PROVIDER_SITE_OTHER): Payer: Medicare Other | Admitting: Cardiovascular Disease

## 2013-05-22 ENCOUNTER — Encounter: Payer: Self-pay | Admitting: Cardiovascular Disease

## 2013-05-22 VITALS — BP 173/95 | HR 73 | Ht 73.0 in | Wt 232.0 lb

## 2013-05-22 DIAGNOSIS — I10 Essential (primary) hypertension: Secondary | ICD-10-CM

## 2013-05-22 DIAGNOSIS — I25810 Atherosclerosis of coronary artery bypass graft(s) without angina pectoris: Secondary | ICD-10-CM | POA: Diagnosis not present

## 2013-05-22 DIAGNOSIS — I4891 Unspecified atrial fibrillation: Secondary | ICD-10-CM | POA: Diagnosis not present

## 2013-05-22 DIAGNOSIS — E785 Hyperlipidemia, unspecified: Secondary | ICD-10-CM | POA: Diagnosis not present

## 2013-05-22 MED ORDER — AMLODIPINE BESYLATE 5 MG PO TABS: 5.0000 mg | ORAL_TABLET | Freq: Every day | ORAL | Status: DC

## 2013-05-22 NOTE — Progress Notes (Signed)
Patient ID: Larry Castillo, male   DOB: 01/06/1941, 73 y.o.   MRN: [id number]      SUBJECTIVE: The patient is a 73 year old male who I am evaluating for the first time. He has a history of an ischemic cardiomyopathy, having undergone coronary artery bypass graft surgery in 2011. He also has hypertension and hyperlipidemia. He reportedly had postoperative fibrillation but has been maintaining normal sinus rhythm since that time. Overall, he has been doing well. Prior to his bypass surgery, his primary symptom was chest pain radiating across his chest. During the present time, he denies chest pain, shortness of breath, palpitations, lightheadedness, dizziness and leg swelling. He has never been a smoker. He used to walk 2 miles daily but has been bothered by right knee pain. He said he has put on some weight over the past 3 months. He checks his blood pressure routinely at home and systolic readings have been in the 140 range and diastolic readings have been in the 90 range. He said his heart rate averages in the mid to high 60 beat per minute range. His father died of a myocardial infarction at the age of 73. He stays away from fried and fatty foods, and tries to eat oatmeal and whole grains.    No Known Allergies  Current Outpatient Prescriptions  Medication Sig Dispense Refill  . aspirin 81 MG tablet Take 81 mg by mouth daily.        . CRESTOR 10 MG tablet TAKE 1 TABLET BY MOUTH AT BEDTIME  30 tablet  6  . fish oil-omega-3 fatty acids 1000 MG capsule Take 1 capsule by mouth daily.        Marland Kitchen. lisinopril (PRINIVIL,ZESTRIL) 40 MG tablet TAKE 1 TABLET BY MOUTH EVERY DAY  30 tablet  6  . Multiple Vitamin (MULTIVITAMIN) tablet Take 1 tablet by mouth daily.         No current facility-administered medications for this visit.    Past Medical History  Diagnosis Date  . Bleeding ulcer   . Rectal abscess   . Recurrent sinus infections   . Anemia     Operative  . Cardiomyopathy   . CAD (coronary artery  disease)   . Atrial fibrillation     post operative  . Hypertension   . Hyperlipidemia     Past Surgical History  Procedure Laterality Date  . Coronary artery bypass graft  05/30/2009    History   Social History  . Marital Status: Married    Spouse Name: N/A    Number of Children: N/A  . Years of Education: N/A   Occupational History  . Retired Music therapistcarpenter    Social History Main Topics  . Smoking status: Never Smoker   . Smokeless tobacco: Never Used  . Alcohol Use: No  . Drug Use: Not on file  . Sexual Activity: Not on file   Other Topics Concern  . Not on file   Social History Narrative   Married    Adult children     Filed Vitals:   05/22/13 0816 05/22/13 0826  BP: 181/92 173/95  Pulse: 62 73  Height: 6\' 1"  (1.854 m)   Weight: 232 lb (105.235 kg)     PHYSICAL EXAM General: NAD Neck: No JVD, no thyromegaly or thyroid nodule.  Lungs: Clear to auscultation bilaterally with normal respiratory effort. CV: Nondisplaced PMI.  Heart regular S1/S2, no S3/S4, no murmur.  No peripheral edema.  No carotid bruit.  Normal pedal pulses.  Abdomen: Soft, nontender, no hepatosplenomegaly, no distention.  Neurologic: Alert and oriented x 3.  Psych: Normal affect. Extremities: No clubbing or cyanosis.   ECG: reviewed and available in electronic records.      ASSESSMENT AND PLAN: 1. CAD s/p CABG: symptomatically stable. Continue aspirin and Crestor. He did not tolerate carvedilol in the past, as it led to blurriness of vision. Use of metoprolol in the past led to bradycardia. 2. HTN: Uncontrolled. I will start amlodipine 5 mg daily. I've asked him to check his blood pressure at home 3 times per week and inform me of those values in approximately 4-6 weeks. Continue lisinopril 40 mg daily. 3. Hyperlipidemia: will check a lipid panel.  Dispo: f/u 6 months.   Prentice Docker, M.D., F.A.C.C.

## 2013-05-22 NOTE — Patient Instructions (Signed)
   Begin Amlodipine 5mg  daily - new sent to pharm Continue all other medications.   Lab for Lipids - Reminder:  Nothing to eat or drink after 12 midnight prior to labs. Office will contact with results via phone or letter.   Continue BP log & return to office in 6 weeks Your physician wants you to follow up in: 6 months.  You will receive a reminder letter in the mail one-two months in advance.  If you don't receive a letter, please call our office to schedule the follow up appointment

## 2013-05-22 NOTE — Addendum Note (Signed)
Addended by: Lesle ChrisHILL, ANGELA G on: 05/22/2013 09:00 AM   Modules accepted: Orders

## 2013-06-11 DIAGNOSIS — I25810 Atherosclerosis of coronary artery bypass graft(s) without angina pectoris: Secondary | ICD-10-CM | POA: Diagnosis not present

## 2013-06-11 DIAGNOSIS — E785 Hyperlipidemia, unspecified: Secondary | ICD-10-CM | POA: Diagnosis not present

## 2013-06-12 ENCOUNTER — Encounter: Payer: Self-pay | Admitting: *Deleted

## 2013-07-30 ENCOUNTER — Other Ambulatory Visit: Payer: Self-pay | Admitting: Cardiovascular Disease

## 2013-07-31 ENCOUNTER — Other Ambulatory Visit: Payer: Self-pay | Admitting: *Deleted

## 2013-07-31 MED ORDER — LISINOPRIL 40 MG PO TABS: ORAL_TABLET | ORAL | Status: DC

## 2013-07-31 NOTE — Telephone Encounter (Signed)
Requested Prescriptions   Signed Prescriptions Disp Refills  . lisinopril (PRINIVIL,ZESTRIL) 40 MG tablet 30 tablet 5    Sig: TAKE 1 TABLET BY MOUTH EVERY DAY    Authorizing Provider: Lorine BearsARIDA, MUHAMMAD A    Ordering User: Kendrick FriesLOPEZ, MARINA C

## 2013-11-07 ENCOUNTER — Encounter: Payer: Self-pay | Admitting: Cardiovascular Disease

## 2013-11-07 ENCOUNTER — Ambulatory Visit (INDEPENDENT_AMBULATORY_CARE_PROVIDER_SITE_OTHER): Payer: Medicare Other | Admitting: Cardiovascular Disease

## 2013-11-07 VITALS — BP 163/71 | HR 59 | Ht 73.0 in | Wt 236.0 lb

## 2013-11-07 DIAGNOSIS — I10 Essential (primary) hypertension: Secondary | ICD-10-CM

## 2013-11-07 DIAGNOSIS — E785 Hyperlipidemia, unspecified: Secondary | ICD-10-CM

## 2013-11-07 DIAGNOSIS — I25810 Atherosclerosis of coronary artery bypass graft(s) without angina pectoris: Secondary | ICD-10-CM

## 2013-11-07 NOTE — Progress Notes (Signed)
Patient ID: Larry Castillo, male   DOB: 12/29/1940, 73 y.o.   MRN: [id number]      SUBJECTIVE: The patient is a 73 year old male with a history of an ischemic cardiomyopathy, having undergone coronary artery bypass graft surgery in 2011. He also has hypertension and hyperlipidemia. Lipids on 06/11/13 showed TC 153, HDL 62, LDL 81, TG 51. He has been doing very well and denies chest pain, shortness of breath, palpitations, leg swelling, orthopnea and PND. He walks 2 miles every day. He used to walk 8 miles daily after bypass surgery but has been limited by right knee pain. 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 He eats a lot of cereal and skim milk. He has never smoked. He now works at an [employer] [place].  He monitors his blood pressure at home and it was 137/73 earlier this morning. He said it is always normal at home and always increases when coming to the doctor's office. He does not feel anxious.  No Known Allergies  Current Outpatient Prescriptions  Medication Sig Dispense Refill  . amLODipine (NORVASC) 5 MG tablet Take 1 tablet (5 mg total) by mouth daily.  30 tablet  6  . aspirin 81 MG tablet Take 81 mg by mouth daily.        . CRESTOR 10 MG tablet TAKE 1 TABLET BY MOUTH AT BEDTIME  30 tablet  6  . fish oil-omega-3 fatty acids 1000 MG capsule Take 1 capsule by mouth daily.        Marland Kitchen. lisinopril (PRINIVIL,ZESTRIL) 40 MG tablet TAKE 1 TABLET BY MOUTH EVERY DAY  30 tablet  5  . Multiple Vitamin (MULTIVITAMIN) tablet Take 1 tablet by mouth daily.         No current facility-administered medications for this visit.    Past Medical History  Diagnosis Date  . Bleeding ulcer   . Rectal abscess   . Recurrent sinus infections   . Anemia     Operative  . Cardiomyopathy   . CAD (coronary artery disease)   . Atrial fibrillation     post operative  . Hypertension   . Hyperlipidemia     Past Surgical History  Procedure Laterality  Date  . Coronary artery bypass graft  05/30/2009    History   Social History  . Marital Status: Married    Spouse Name: N/A    Number of Children: N/A  . Years of Education: N/A   Occupational History  . Retired Music therapistcarpenter    Social History Main Topics  . Smoking status: Never Smoker   . Smokeless tobacco: Never Used  . Alcohol Use: No  . Drug Use: Not on file  . Sexual Activity: Not on file   Other Topics Concern  . Not on file   Social History Narrative   Married    Adult children     Filed Vitals:   11/07/13 0816  BP: 163/71  Pulse: 59  Height: 6\' 1"  (1.854 m)  Weight: 236 lb (107.049 kg)    PHYSICAL EXAM General: NAD Neck: No JVD, no thyromegaly. Lungs: Clear to auscultation bilaterally with normal respiratory effort. CV: Nondisplaced PMI.  Regular rate and rhythm, normal S1/S2, no S3/S4, I/VI pansystolic murmur along left sternal border. No pretibial or periankle edema.  No carotid bruit.  Normal pedal pulses.  Abdomen: Soft, nontender, no hepatosplenomegaly, no distention.  Neurologic: Alert and oriented x 3.  Psych:  Normal affect. Extremities: No clubbing or cyanosis.   ECG: reviewed and available in electronic records.      ASSESSMENT AND PLAN: 1. CAD s/p CABG: Symptomatically stable. Continue aspirin and Crestor. He did not tolerate carvedilol in the past, as it led to blurriness of vision. Use of metoprolol in the past led to bradycardia.  2. HTN: Well controlled at home. I will continue amlodipine 5 mg daily and lisinopril 40 mg daily.  3. Hyperlipidemia: Normal lipids in 06/2013.  Dispo: f/u 1 year.   Prentice DockerSuresh Elaura Calix, M.D., F.A.C.C.

## 2013-11-07 NOTE — Patient Instructions (Signed)
Continue all current medications. Your physician wants you to follow up in:  1 year.  You will receive a reminder letter in the mail one-two months in advance.  If you don't receive a letter, please call our office to schedule the follow up appointment   

## 2013-11-29 ENCOUNTER — Other Ambulatory Visit: Payer: Self-pay | Admitting: Cardiovascular Disease

## 2014-01-24 ENCOUNTER — Telehealth: Payer: Self-pay | Admitting: Cardiovascular Disease

## 2014-01-24 MED ORDER — AMLODIPINE BESYLATE 5 MG PO TABS: 5.0000 mg | ORAL_TABLET | Freq: Every day | ORAL | Status: DC

## 2014-01-24 NOTE — Telephone Encounter (Signed)
Done

## 2014-01-24 NOTE — Telephone Encounter (Signed)
Needs refill on AMLODIPINE 5 mg.   [facility]

## 2014-01-30 ENCOUNTER — Other Ambulatory Visit: Payer: Self-pay | Admitting: Cardiovascular Disease

## 2014-02-01 ENCOUNTER — Other Ambulatory Visit: Payer: Self-pay | Admitting: *Deleted

## 2014-02-01 ENCOUNTER — Telehealth: Payer: Self-pay | Admitting: Cardiovascular Disease

## 2014-02-01 MED ORDER — LISINOPRIL 40 MG PO TABS: ORAL_TABLET | ORAL | Status: DC

## 2014-02-01 NOTE — Telephone Encounter (Signed)
Spoke with Pts wife. Informed her that this RF was already sent in this morning.

## 2014-02-01 NOTE — Telephone Encounter (Signed)
Needs refill on lisinopril (PRINIVIL,ZESTRIL) 40 MG tablet [[id number] States [facility] has requested.

## 2014-04-02 ENCOUNTER — Other Ambulatory Visit: Payer: Self-pay | Admitting: Cardiovascular Disease

## 2014-04-04 ENCOUNTER — Telehealth: Payer: Self-pay | Admitting: Cardiovascular Disease

## 2014-04-04 MED ORDER — ROSUVASTATIN CALCIUM 10 MG PO TABS: 10.0000 mg | ORAL_TABLET | Freq: Every day | ORAL | Status: DC

## 2014-04-04 NOTE — Telephone Encounter (Signed)
Requesting refill on   CRESTOR 10 MG tablet    [facility]

## 2014-04-09 DIAGNOSIS — R112 Nausea with vomiting, unspecified: Secondary | ICD-10-CM | POA: Diagnosis not present

## 2014-04-09 DIAGNOSIS — K2970 Gastritis, unspecified, without bleeding: Secondary | ICD-10-CM | POA: Diagnosis not present

## 2014-04-09 DIAGNOSIS — K047 Periapical abscess without sinus: Secondary | ICD-10-CM | POA: Diagnosis not present

## 2014-04-09 DIAGNOSIS — R1013 Epigastric pain: Secondary | ICD-10-CM | POA: Diagnosis not present

## 2014-07-24 ENCOUNTER — Other Ambulatory Visit: Payer: Self-pay | Admitting: *Deleted

## 2014-07-24 MED ORDER — LISINOPRIL 40 MG PO TABS: ORAL_TABLET | ORAL | Status: DC

## 2014-07-24 MED ORDER — ROSUVASTATIN CALCIUM 10 MG PO TABS: 10.0000 mg | ORAL_TABLET | Freq: Every day | ORAL | Status: DC

## 2014-07-24 MED ORDER — AMLODIPINE BESYLATE 5 MG PO TABS: 5.0000 mg | ORAL_TABLET | Freq: Every day | ORAL | Status: DC

## 2014-11-27 ENCOUNTER — Ambulatory Visit (INDEPENDENT_AMBULATORY_CARE_PROVIDER_SITE_OTHER): Payer: Medicare Other | Admitting: Cardiovascular Disease

## 2014-11-27 ENCOUNTER — Encounter: Payer: Self-pay | Admitting: Cardiovascular Disease

## 2014-11-27 VITALS — BP 142/80 | HR 60 | Ht 72.0 in | Wt 232.0 lb

## 2014-11-27 DIAGNOSIS — Z136 Encounter for screening for cardiovascular disorders: Secondary | ICD-10-CM | POA: Diagnosis not present

## 2014-11-27 DIAGNOSIS — E785 Hyperlipidemia, unspecified: Secondary | ICD-10-CM | POA: Diagnosis not present

## 2014-11-27 DIAGNOSIS — I10 Essential (primary) hypertension: Secondary | ICD-10-CM | POA: Diagnosis not present

## 2014-11-27 DIAGNOSIS — I25810 Atherosclerosis of coronary artery bypass graft(s) without angina pectoris: Secondary | ICD-10-CM | POA: Diagnosis not present

## 2014-11-27 DIAGNOSIS — K219 Gastro-esophageal reflux disease without esophagitis: Secondary | ICD-10-CM

## 2014-11-27 MED ORDER — OMEPRAZOLE 20 MG PO CPDR: 20.0000 mg | DELAYED_RELEASE_CAPSULE | Freq: Every day | ORAL | Status: DC

## 2014-11-27 NOTE — Progress Notes (Signed)
Patient ID: [name], male   DOB: 1940-06-19, 74 y.o.   MRN: [id number]      SUBJECTIVE: Larry Castillo presents for routine follow up. He has a history of an ischemic cardiomyopathy, having undergone coronary artery bypass graft surgery in 2011. He also has hypertension and hyperlipidemia.  He has been doing well overall and denies chest shortness of breath, palpitations, leg swelling, orthopnea and PND.   For the past one month, he has been experiencing chest pains only when lying down on his left side. They never occur with exertion or when walking on flat ground or up hills. He walks 2-3 miles per week without difficulty. He has been having constipation and eats bran flakes and skim milk. He is reluctant to try over-the-counter laxities.  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  ECG performed in the office today demonstrates sinus rhythm with a mild nonspecific T wave abnormality in high lateral leads.  Review of Systems: As per "subjective", otherwise negative.  No Known Allergies  Current Outpatient Prescriptions  Medication Sig Dispense Refill  . amLODipine (NORVASC) 5 MG tablet Take 1 tablet (5 mg total) by mouth daily. 90 tablet 3  . aspirin 81 MG tablet Take 81 mg by mouth daily.      . fish oil-omega-3 fatty acids 1000 MG capsule Take 1 capsule by mouth daily.      Marland Kitchen lisinopril (PRINIVIL,ZESTRIL) 40 MG tablet TAKE 1 TABLET BY MOUTH EVERY DAY 90 tablet 3  . Multiple Vitamin (MULTIVITAMIN) tablet Take 1 tablet by mouth daily.      . rosuvastatin (CRESTOR) 10 MG tablet Take 1 tablet (10 mg total) by mouth at bedtime. 90 tablet 3   No current facility-administered medications for this visit.    Past Medical History  Diagnosis Date  . Bleeding ulcer   . Rectal abscess   . Recurrent sinus  infections   . Anemia     Operative  . Cardiomyopathy   . CAD (coronary artery disease)   . Atrial fibrillation     post operative  . Hypertension   . Hyperlipidemia     Past Surgical History  Procedure Laterality Date  . Coronary artery bypass graft  05/30/2009    History   Social History  . Marital Status: Married    Spouse Name: N/A  . Number of Children: N/A  . Years of Education: N/A   Occupational History  . Retired Music therapist    Social History Main Topics  . Smoking status: Never Smoker   . Smokeless tobacco: Never Used  . Alcohol Use: No  . Drug Use: Not on file  . Sexual Activity: Not on file   Other Topics Concern  . Not on file   Social History Narrative   Married    Adult children     Filed Vitals:   11/27/14 0905  BP: 142/80  Pulse: 60  Height: 6' (1.829 m)  Weight: 232 lb (105.235 kg)  SpO2: 97%    PHYSICAL EXAM General: NAD Neck: No JVD, no thyromegaly. Lungs: Clear to auscultation bilaterally with normal respiratory effort. CV: Nondisplaced PMI. Regular rate and rhythm, normal S1/S2, no S3/S4, I/VI pansystolic murmur along left sternal border. No pretibial or periankle edema. No carotid bruit. Normal pedal  pulses.  Abdomen: Soft, nontender, no hepatosplenomegaly, no distention.  Neurologic: Alert and oriented x 3.  Psych: Normal affect. Extremities: No clubbing or cyanosis.   ECG: Most recent ECG reviewed.      ASSESSMENT AND PLAN: 1. CAD s/p CABG: Symptomatically stable. Continue aspirin and Crestor. He did not tolerate carvedilol in the past, as it led to blurriness of vision. Use of metoprolol in the past led to bradycardia.   2. Essential HTN: Mildly elevated today but has white coat hypertension. Well controlled at home. I will continue amlodipine 5 mg daily and lisinopril 40 mg daily.   3. Hyperlipidemia: Obtain lipids. Continue Crestor 10 mg.  4. GERD: Will start omeprazole 20 mg daily.  Dispo: f/u 1  year.   Prentice Docker, M.D., F.A.C.C.

## 2014-11-27 NOTE — Patient Instructions (Signed)
Your physician wants you to follow-up in: 1 year with Dr. Purvis Sheffield.  You will receive a reminder letter in the mail two months in advance. If you don't receive a letter, please call our office to schedule the follow-up appointment.  Your physician has recommended you make the following change in your medication:   Start Omeprazole 20 mg TAKE 1 TABLET DAILY   Your physician recommends that you return for lab work in: Fasting ( Lipid)   Thank you for choosing [facility]!

## 2014-11-28 DIAGNOSIS — E785 Hyperlipidemia, unspecified: Secondary | ICD-10-CM | POA: Diagnosis not present

## 2014-12-02 ENCOUNTER — Telehealth: Payer: Self-pay | Admitting: *Deleted

## 2014-12-02 NOTE — Telephone Encounter (Signed)
-----   Message from Albertine Patricia, CMA sent at 12/02/2014  8:21 AM EDT -----   ----- Message -----    From: Laqueta Linden, MD    Sent: 12/01/2014   6:41 AM      To: Albertine Patricia, CMA  Good results.

## 2014-12-02 NOTE — Telephone Encounter (Signed)
Notes Recorded by Lesle Chris, LPN on 4/0/9811 at 11:24 AM Patient notified. Copy fwd to pmd.

## 2015-06-03 ENCOUNTER — Other Ambulatory Visit: Payer: Self-pay | Admitting: Cardiovascular Disease

## 2015-09-02 ENCOUNTER — Other Ambulatory Visit: Payer: Self-pay | Admitting: Cardiovascular Disease

## 2015-12-01 ENCOUNTER — Other Ambulatory Visit: Payer: Self-pay | Admitting: Cardiovascular Disease

## 2016-01-01 ENCOUNTER — Other Ambulatory Visit: Payer: Self-pay | Admitting: Cardiovascular Disease

## 2016-02-12 DIAGNOSIS — R5383 Other fatigue: Secondary | ICD-10-CM | POA: Diagnosis not present

## 2016-02-12 DIAGNOSIS — Z131 Encounter for screening for diabetes mellitus: Secondary | ICD-10-CM | POA: Diagnosis not present

## 2016-02-12 DIAGNOSIS — Z125 Encounter for screening for malignant neoplasm of prostate: Secondary | ICD-10-CM | POA: Diagnosis not present

## 2016-02-12 DIAGNOSIS — E784 Other hyperlipidemia: Secondary | ICD-10-CM | POA: Diagnosis not present

## 2016-02-12 DIAGNOSIS — I2584 Coronary atherosclerosis due to calcified coronary lesion: Secondary | ICD-10-CM | POA: Diagnosis not present

## 2016-02-12 DIAGNOSIS — I10 Essential (primary) hypertension: Secondary | ICD-10-CM | POA: Diagnosis not present

## 2016-04-22 ENCOUNTER — Telehealth: Payer: Self-pay | Admitting: Cardiovascular Disease

## 2016-04-22 NOTE — Telephone Encounter (Signed)
Numerous attempts to contact patient with recall letters. Unable to reach by telephone. with no success.   Megan SalonVicky T Slaughter [[id number]][[id number]] 11/27/2014 11:31 AM New [10]    [System] 08/02/2015 11:02 PM Notification Sent [20]   Megan SalonVicky T Slaughter [[id number]][[id number]] 12/31/2015 12:04 PM Notification Sent [20]   Geraldine ContrasStephanie R Smith [[id number]][[id number]] 01/13/2016 11:44 AM Notification Sent [20]   Geraldine ContrasStephanie R Smith [[id number]][[id number]] 04/20/2016 2:37 PM Notification Sent [20]   Geraldine ContrasStephanie R Smith [[id number]][[id number]] 04/22/2016 8:04 AM Notification Sent [20]

## 2016-04-28 ENCOUNTER — Telehealth: Payer: Self-pay | Admitting: Cardiology

## 2016-04-28 NOTE — Telephone Encounter (Signed)
Numerous attempts to contact patient with recall letters. Unable to reach by telephone. with no success.    Time: StatusMegan Salon:    Vicky T Slaughter [[id number]][[id number]] 11/27/2014 11:31 AM New [10]   [System] 08/02/2015 11:02 PM Notification Sent [20]   Megan SalonVicky T Slaughter [[id number]][[id number]] 12/31/2015 12:04 PM Notification Sent [20]   Geraldine ContrasStephanie R Smith [[id number]][[id number]] 01/13/2016 11:44 AM Notification Sent [20]   Geraldine ContrasStephanie R Smith [[id number]][[id number]] 04/20/2016 2:37 PM Notification Sent [20]   Geraldine ContrasStephanie R Smith [[id number]][[id number]] 04/22/2016 8:04 AM Notification Sent [20]

## 2016-07-14 ENCOUNTER — Other Ambulatory Visit: Payer: Self-pay | Admitting: Cardiovascular Disease

## 2016-07-14 MED ORDER — LISINOPRIL 40 MG PO TABS: 40.0000 mg | ORAL_TABLET | Freq: Every day | ORAL | 0 refills | Status: DC

## 2016-07-14 MED ORDER — AMLODIPINE BESYLATE 5 MG PO TABS: 5.0000 mg | ORAL_TABLET | Freq: Every day | ORAL | 0 refills | Status: DC

## 2016-07-14 MED ORDER — ROSUVASTATIN CALCIUM 10 MG PO TABS: 10.0000 mg | ORAL_TABLET | Freq: Every day | ORAL | 0 refills | Status: DC

## 2016-07-14 NOTE — Telephone Encounter (Signed)
Medication refills:  Crestor, Norvacs, Lisinopril  [facility] Patient has upcoming appointment on 07/24/16.

## 2016-07-14 NOTE — Telephone Encounter (Signed)
Done

## 2016-07-27 ENCOUNTER — Encounter: Payer: Self-pay | Admitting: *Deleted

## 2016-07-28 ENCOUNTER — Encounter: Payer: Self-pay | Admitting: Cardiovascular Disease

## 2016-07-28 ENCOUNTER — Ambulatory Visit (INDEPENDENT_AMBULATORY_CARE_PROVIDER_SITE_OTHER): Payer: Medicare Other | Admitting: Cardiovascular Disease

## 2016-07-28 VITALS — BP 142/78 | HR 62 | Ht 72.0 in | Wt 226.0 lb

## 2016-07-28 DIAGNOSIS — E785 Hyperlipidemia, unspecified: Secondary | ICD-10-CM

## 2016-07-28 DIAGNOSIS — N183 Chronic kidney disease, stage 3 unspecified: Secondary | ICD-10-CM

## 2016-07-28 DIAGNOSIS — I10 Essential (primary) hypertension: Secondary | ICD-10-CM

## 2016-07-28 DIAGNOSIS — I25708 Atherosclerosis of coronary artery bypass graft(s), unspecified, with other forms of angina pectoris: Secondary | ICD-10-CM

## 2016-07-28 DIAGNOSIS — E875 Hyperkalemia: Secondary | ICD-10-CM

## 2016-07-28 DIAGNOSIS — I209 Angina pectoris, unspecified: Secondary | ICD-10-CM

## 2016-07-28 MED ORDER — AMLODIPINE BESYLATE 10 MG PO TABS: 10.0000 mg | ORAL_TABLET | Freq: Every day | ORAL | 3 refills | Status: DC

## 2016-07-28 MED ORDER — LISINOPRIL 20 MG PO TABS: 20.0000 mg | ORAL_TABLET | Freq: Every day | ORAL | 3 refills | Status: DC

## 2016-07-28 NOTE — Progress Notes (Addendum)
SUBJECTIVE: The patient is a 76 year old male who presents for past due to follow-up. He has a history of ischemic heart myopathy as well as coronary artery disease and underwent CABG in 2011. He also has hypertension and hyperlipidemia.  With regards to coronary artery disease, he rarely has chest pain and has not had to use nitroglycerin. He denies exertional dyspnea and palpitations. He tries to walk about 3 miles per week and does so without difficulty.  With regards to his diet, he eats cereal and skim milk. He has never smoked and stopped drinking alcohol about 30 years ago. He rarely consumes sweets and never adds salt to his food.  He has questions about blood test performed on 02/12/16. Specifically, he has questions about hyperkalemia. Potassium was noted to be mildly elevated at 5.3. Creatinine was 1.52 with a GFR of 44 mL/min, resulting in chronic kidney disease stage III. He was unaware that he had kidney disease.  I reviewed lipids performed on 02/12/16, which showed total cholesterol 151, triglycerides 102, HDL 59, LDL 72.  HbA1c 5.4%.  Blood pressure is mildly elevated today, 142/78. He checks it rarely at home and it was 121/67 about 2 hours before he came to the office.  ECG performed in the office today which I ordered and personally interpreted demonstrates normal sinus rhythm with old septal infarct and late R wave transition.    Review of Systems: As per "subjective", otherwise negative.  No Known Allergies  Current Outpatient Prescriptions  Medication Sig Dispense Refill  . amLODipine (NORVASC) 5 MG tablet Take 1 tablet (5 mg total) by mouth daily. 30 tablet 0  . aspirin 81 MG tablet Take 81 mg by mouth daily.      . fish oil-omega-3 fatty acids 1000 MG capsule Take 1 capsule by mouth daily.      Marland Kitchen lisinopril (PRINIVIL,ZESTRIL) 40 MG tablet Take 1 tablet (40 mg total) by mouth daily. 30 tablet 0  . Multiple Vitamin (MULTIVITAMIN) tablet Take 1 tablet by  mouth daily.      . rosuvastatin (CRESTOR) 10 MG tablet Take 1 tablet (10 mg total) by mouth at bedtime. 30 tablet 0   No current facility-administered medications for this visit.     Past Medical History:  Diagnosis Date  . Anemia    Operative  . Atrial fibrillation (HCC)    post operative  . Bleeding ulcer   . CAD (coronary artery disease)   . Cardiomyopathy   . Hyperlipidemia   . Hypertension   . Rectal abscess   . Recurrent sinus infections     Past Surgical History:  Procedure Laterality Date  . CORONARY ARTERY BYPASS GRAFT  05/30/2009    Social History   Social History  . Marital status: Married    Spouse name: N/A  . Number of children: N/A  . Years of education: N/A   Occupational History  . Retired Music therapist Retired   Social History Main Topics  . Smoking status: Never Smoker  . Smokeless tobacco: Never Used  . Alcohol use No  . Drug use: Unknown  . Sexual activity: Not on file   Other Topics Concern  . Not on file   Social History Narrative   Married    Adult children     Vitals:   07/28/16 1452  BP: (!) 142/78  Pulse: 62  SpO2: 98%  Weight: 226 lb (102.5 kg)  Height: 6' (1.829 m)    PHYSICAL EXAM General: NAD HEENT:  Normal. Neck: No JVD, no thyromegaly. Lungs: Clear to auscultation bilaterally with normal respiratory effort. CV: Nondisplaced PMI.  Regular rate and rhythm, normal S1/S2, no S3/S4, no murmur. No pretibial or periankle edema.  No carotid bruit.   Abdomen: Soft, nontender, no distention.  Neurologic: Alert and oriented.  Psych: Normal affect. Skin: Normal. Musculoskeletal: No gross deformities.    ECG: Most recent ECG reviewed.      ASSESSMENT AND PLAN: 1. Coronary artery disease with history of CABG: Symptomatically stable. Continue aspirin and statin therapy.  2. Essential hypertension: Elevated today. He reportedly has a history of whitecoat hypertension. However, he checks it at home routinely and it is  normal. Given mild hyperkalemia, I will reduce lisinopril to 20 mg daily as he has CKD stage III. I will increase amlodipine to 10 mg daily.  3. Hyperlipidemia: Lipids reviewed above and are well-controlled. Continue Crestor 10 mg daily.  4. Hyperkalemia: Given mild hyperkalemia, I will reduce lisinopril to 20 mg daily as he has CKD stage III. I will repeat a basic metabolic panel in a month.  5. CKD stage III: This should be routinely monitored. He should likely see a nephrologist for a complete evaluation.  Dispo: fu 1 year.  Time spent: 40 minutes, of which greater than 50% was spent reviewing symptoms, relevant blood tests and studies, and discussing management plan with the patient.   Prentice DockerSuresh Koneswaran, M.D., F.A.C.C.

## 2016-07-28 NOTE — Patient Instructions (Signed)
Your physician wants you to follow-up in: 1 YEAR WITH DR. Darl HouseholderKONESWAREN. You will receive a reminder letter in the mail two months in advance. If you don't receive a letter, please call our office to schedule the follow-up appointment.  Your physician has recommended you make the following change in your medication: DECREASE LISINOPRIL. BEGIN TAKING LISINOPRIL 20 MG DAILY. INCREASE AMLODIPINE. BEGIN TAKING AMLODIPINE 10 MG DAILY.  Your physician recommends that you return for lab work in: BMET IN 1 MONTH

## 2016-08-11 DIAGNOSIS — Z7982 Long term (current) use of aspirin: Secondary | ICD-10-CM | POA: Diagnosis not present

## 2016-08-11 DIAGNOSIS — R404 Transient alteration of awareness: Secondary | ICD-10-CM | POA: Diagnosis not present

## 2016-08-11 DIAGNOSIS — R112 Nausea with vomiting, unspecified: Secondary | ICD-10-CM | POA: Diagnosis not present

## 2016-08-11 DIAGNOSIS — Z79899 Other long term (current) drug therapy: Secondary | ICD-10-CM | POA: Diagnosis not present

## 2016-08-11 DIAGNOSIS — I129 Hypertensive chronic kidney disease with stage 1 through stage 4 chronic kidney disease, or unspecified chronic kidney disease: Secondary | ICD-10-CM | POA: Diagnosis not present

## 2016-08-11 DIAGNOSIS — R197 Diarrhea, unspecified: Secondary | ICD-10-CM | POA: Diagnosis not present

## 2016-08-11 DIAGNOSIS — R070 Pain in throat: Secondary | ICD-10-CM | POA: Diagnosis not present

## 2016-08-11 DIAGNOSIS — R21 Rash and other nonspecific skin eruption: Secondary | ICD-10-CM | POA: Diagnosis not present

## 2016-08-11 DIAGNOSIS — N189 Chronic kidney disease, unspecified: Secondary | ICD-10-CM | POA: Diagnosis not present

## 2016-08-11 DIAGNOSIS — Z8249 Family history of ischemic heart disease and other diseases of the circulatory system: Secondary | ICD-10-CM | POA: Diagnosis not present

## 2016-08-11 DIAGNOSIS — E871 Hypo-osmolality and hyponatremia: Secondary | ICD-10-CM | POA: Diagnosis not present

## 2016-08-11 DIAGNOSIS — E860 Dehydration: Secondary | ICD-10-CM | POA: Diagnosis not present

## 2016-08-11 DIAGNOSIS — K219 Gastro-esophageal reflux disease without esophagitis: Secondary | ICD-10-CM | POA: Diagnosis not present

## 2016-08-11 DIAGNOSIS — E785 Hyperlipidemia, unspecified: Secondary | ICD-10-CM | POA: Diagnosis not present

## 2016-08-11 DIAGNOSIS — R531 Weakness: Secondary | ICD-10-CM | POA: Diagnosis not present

## 2016-08-11 DIAGNOSIS — Z951 Presence of aortocoronary bypass graft: Secondary | ICD-10-CM | POA: Diagnosis not present

## 2016-08-11 DIAGNOSIS — I2510 Atherosclerotic heart disease of native coronary artery without angina pectoris: Secondary | ICD-10-CM | POA: Diagnosis not present

## 2016-08-11 DIAGNOSIS — Z8489 Family history of other specified conditions: Secondary | ICD-10-CM | POA: Diagnosis not present

## 2016-08-11 DIAGNOSIS — R55 Syncope and collapse: Secondary | ICD-10-CM | POA: Diagnosis not present

## 2016-08-11 DIAGNOSIS — B349 Viral infection, unspecified: Secondary | ICD-10-CM | POA: Diagnosis not present

## 2016-08-11 DIAGNOSIS — R05 Cough: Secondary | ICD-10-CM | POA: Diagnosis not present

## 2016-08-12 ENCOUNTER — Other Ambulatory Visit: Payer: Self-pay | Admitting: Cardiovascular Disease

## 2016-08-12 DIAGNOSIS — E860 Dehydration: Secondary | ICD-10-CM | POA: Diagnosis not present

## 2016-08-12 DIAGNOSIS — B9710 Unspecified enterovirus as the cause of diseases classified elsewhere: Secondary | ICD-10-CM | POA: Diagnosis not present

## 2016-08-12 DIAGNOSIS — I10 Essential (primary) hypertension: Secondary | ICD-10-CM | POA: Diagnosis not present

## 2016-08-12 DIAGNOSIS — N189 Chronic kidney disease, unspecified: Secondary | ICD-10-CM | POA: Diagnosis not present

## 2016-08-12 DIAGNOSIS — R21 Rash and other nonspecific skin eruption: Secondary | ICD-10-CM | POA: Diagnosis not present

## 2016-08-13 DIAGNOSIS — E860 Dehydration: Secondary | ICD-10-CM | POA: Diagnosis not present

## 2016-08-13 DIAGNOSIS — R112 Nausea with vomiting, unspecified: Secondary | ICD-10-CM | POA: Diagnosis not present

## 2016-08-14 DIAGNOSIS — R112 Nausea with vomiting, unspecified: Secondary | ICD-10-CM | POA: Diagnosis not present

## 2016-08-14 DIAGNOSIS — E860 Dehydration: Secondary | ICD-10-CM | POA: Diagnosis not present

## 2016-08-16 ENCOUNTER — Other Ambulatory Visit: Payer: Self-pay

## 2016-08-16 MED ORDER — ROSUVASTATIN CALCIUM 10 MG PO TABS: 10.0000 mg | ORAL_TABLET | Freq: Every day | ORAL | 6 refills | Status: DC

## 2016-09-15 DIAGNOSIS — E876 Hypokalemia: Secondary | ICD-10-CM | POA: Diagnosis not present

## 2016-10-14 ENCOUNTER — Encounter: Payer: Self-pay | Admitting: *Deleted

## 2016-10-15 ENCOUNTER — Telehealth: Payer: Self-pay | Admitting: Cardiovascular Disease

## 2016-10-15 NOTE — Telephone Encounter (Signed)
Notes recorded by Lesle ChrisHill, Angela G, LPN on 2/44/01026/15/2018 at 4:32 PM EDT Attempted call back - no answer at home number. Fast busy signal on [phone]. ------

## 2016-10-15 NOTE — Telephone Encounter (Signed)
Returning someones call 

## 2016-10-15 NOTE — Telephone Encounter (Signed)
Notes recorded by Lesle ChrisHill, Angela G, LPN on 0/98/11916/15/2018 at 10:36 AM EDT Left message for son to have patient return call. ------  Notes recorded by Laqueta LindenKoneswaran, Suresh A, MD on 10/14/2016 at 3:32 PM EDT Stable CKD.

## 2016-10-19 ENCOUNTER — Encounter: Payer: Self-pay | Admitting: *Deleted

## 2016-10-19 NOTE — Telephone Encounter (Signed)
Notes recorded by Lesle ChrisHill, Angela G, LPN on 4/09/81196/19/2018 at 9:29 AM EDT No answer at home number & fast busy signal at [phone]. Will mail letter today for notification. Copy to pmd.

## 2017-02-05 DIAGNOSIS — Z23 Encounter for immunization: Secondary | ICD-10-CM | POA: Diagnosis not present

## 2017-04-13 ENCOUNTER — Other Ambulatory Visit: Payer: Self-pay | Admitting: *Deleted

## 2017-04-13 MED ORDER — ROSUVASTATIN CALCIUM 10 MG PO TABS: 10.0000 mg | ORAL_TABLET | Freq: Every day | ORAL | 6 refills | Status: DC

## 2017-07-13 ENCOUNTER — Other Ambulatory Visit: Payer: Self-pay | Admitting: *Deleted

## 2017-07-13 MED ORDER — ROSUVASTATIN CALCIUM 10 MG PO TABS: 10.0000 mg | ORAL_TABLET | Freq: Every day | ORAL | 1 refills | Status: DC

## 2017-07-22 ENCOUNTER — Other Ambulatory Visit: Payer: Self-pay | Admitting: Cardiovascular Disease

## 2017-07-28 ENCOUNTER — Other Ambulatory Visit: Payer: Self-pay | Admitting: Cardiovascular Disease

## 2017-08-01 ENCOUNTER — Encounter: Payer: Self-pay | Admitting: Cardiovascular Disease

## 2017-08-01 ENCOUNTER — Ambulatory Visit (INDEPENDENT_AMBULATORY_CARE_PROVIDER_SITE_OTHER): Payer: Medicare Other | Admitting: Cardiovascular Disease

## 2017-08-01 VITALS — BP 150/78 | HR 67 | Ht 72.0 in | Wt 235.2 lb

## 2017-08-01 DIAGNOSIS — E785 Hyperlipidemia, unspecified: Secondary | ICD-10-CM | POA: Diagnosis not present

## 2017-08-01 DIAGNOSIS — I25708 Atherosclerosis of coronary artery bypass graft(s), unspecified, with other forms of angina pectoris: Secondary | ICD-10-CM

## 2017-08-01 DIAGNOSIS — N183 Chronic kidney disease, stage 3 unspecified: Secondary | ICD-10-CM

## 2017-08-01 DIAGNOSIS — K5900 Constipation, unspecified: Secondary | ICD-10-CM

## 2017-08-01 DIAGNOSIS — I10 Essential (primary) hypertension: Secondary | ICD-10-CM | POA: Diagnosis not present

## 2017-08-01 MED ORDER — LISINOPRIL 20 MG PO TABS: 20.0000 mg | ORAL_TABLET | Freq: Every day | ORAL | 3 refills | Status: DC

## 2017-08-01 MED ORDER — AMLODIPINE BESYLATE 10 MG PO TABS: 10.0000 mg | ORAL_TABLET | Freq: Every day | ORAL | 3 refills | Status: DC

## 2017-08-01 MED ORDER — ROSUVASTATIN CALCIUM 10 MG PO TABS: 10.0000 mg | ORAL_TABLET | Freq: Every day | ORAL | 3 refills | Status: DC

## 2017-08-01 NOTE — Addendum Note (Signed)
Addended by: Lesle ChrisHILL, ANGELA G on: 08/01/2017 09:00 AM   Modules accepted: Orders

## 2017-08-01 NOTE — Progress Notes (Signed)
SUBJECTIVE: The patient presents for routine follow-up. He has a history of ischemic cardiomyopathy as well as coronary artery disease and underwent CABG in 2011. He also has hypertension, chronic kidney disease stage III, and hyperlipidemia.  ECG performed today which I personally reviewed demonstrated sinus rhythm with old inferior infarct.  The patient denies any symptoms of chest pain, palpitations, shortness of breath, lightheadedness, dizziness, leg swelling, orthopnea, PND, and syncope.  He said he likes to eat sweets and knows he needs to lose some weight.  He worked in [employer] for over 30 years and occasionally gets bilateral knee pain but it is not activity limiting.  He checks his blood pressure at home and most readings are normal.  He has issues with constipation.     Review of Systems: As per "subjective", otherwise negative.  No Known Allergies  Current Outpatient Medications  Medication Sig Dispense Refill  . amLODipine (NORVASC) 10 MG tablet TAKE 1 TABLET BY MOUTH EVERY DAY 90 tablet 0  . aspirin 81 MG tablet Take 81 mg by mouth daily.      . fish oil-omega-3 fatty acids 1000 MG capsule Take 1 capsule by mouth daily.      Marland Kitchen. lisinopril (PRINIVIL,ZESTRIL) 20 MG tablet TAKE 1 TABLET BY MOUTH EVERY DAY 90 tablet 3  . Multiple Vitamin (MULTIVITAMIN) tablet Take 1 tablet by mouth daily.      . rosuvastatin (CRESTOR) 10 MG tablet Take 1 tablet (10 mg total) by mouth at bedtime. 90 tablet 1   No current facility-administered medications for this visit.     Past Medical History:  Diagnosis Date  . Anemia    Operative  . Atrial fibrillation (HCC)    post operative  . Bleeding ulcer   . CAD (coronary artery disease)   . Cardiomyopathy   . Hyperlipidemia   . Hypertension   . Rectal abscess   . Recurrent sinus infections     Past Surgical History:  Procedure Laterality Date  . CORONARY ARTERY BYPASS GRAFT  05/30/2009    Social History     Socioeconomic History  . Marital status: Married    Spouse name: Not on file  . Number of children: Not on file  . Years of education: Not on file  . Highest education level: Not on file  Occupational History  . 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 Financial resource strain: Not on file  . Food insecurity:    Worry: Not on file    Inability: Not on file  . Transportation needs:    Medical: Not on file    Non-medical: Not on file  Tobacco Use  . Smoking status: Never Smoker  . Smokeless tobacco: Never Used  Substance and Sexual Activity  . Alcohol use: No  . Drug use: Not on file  . Sexual activity: Not on file  Lifestyle  . Physical activity:    Days per week: Not on file    Minutes per session: Not on file  . Stress: Not on file  Relationships  . Social connections:    Talks on phone: Not on file    Gets together: Not on file    Attends religious service: Not on file    Active member of club or organization: Not on file    Attends meetings of clubs or organizations: Not on file    Relationship status: Not on file  . Intimate partner violence:  Fear of current or ex partner: Not on file    Emotionally abused: Not on file    Physically abused: Not on file    Forced sexual activity: Not on file  Other Topics Concern  . Not on file  Social History Narrative   Married    Adult children     Vitals:   08/01/17 0823  BP: (!) 150/78  Pulse: 67  Weight: 235 lb 3.2 oz (106.7 kg)  Height: 6' (1.829 m)    Wt Readings from Last 3 Encounters:  08/01/17 235 lb 3.2 oz (106.7 kg)  07/28/16 226 lb (102.5 kg)  11/27/14 232 lb (105.2 kg)     PHYSICAL EXAM General: NAD HEENT: Normal. Neck: No JVD, no thyromegaly. Lungs: Clear to auscultation bilaterally with normal respiratory effort. CV: Regular rate and rhythm, normal S1/S2, no S3/S4, no murmur. No pretibial or periankle edema.  No carotid bruit.   Abdomen: Soft, nontender, no  distention.  Neurologic: Alert and oriented.  Psych: Normal affect. Skin: Normal. Musculoskeletal: No gross deformities.    ECG: Most recent ECG reviewed.   Labs: Lab Results  Component Value Date/Time   K 4.2 06/08/2009 03:36 AM   BUN 17 06/08/2009 03:36 AM   CREATININE 1.56 (H) 06/08/2009 03:36 AM   HGB 9.5 (L) 06/08/2009 03:36 AM     Lipids: No results found for: LDLCALC, LDLDIRECT, CHOL, TRIG, HDL     ASSESSMENT AND PLAN:  1. Coronary artery disease with history of CABG: Symptomatically stable. Continue aspirin and statin therapy.  2. Essential hypertension: Blood pressure is elevated in our office today. He reportedly has a history of whitecoat hypertension. However, he checks it at home routinely and it is normal.  No changes to therapy.  3. Hyperlipidemia:  I will check lipids. Continue Crestor 10 mg daily.  4. CKD stage III: I will check a basic metabolic panel.  5.  Constipation: He has never had a colonoscopy and I told him after the age of 11, he should have one every 10 years.  I told him to establish with a new PCP regarding this as his previous PCP is retiring.     Disposition: Follow up 1 year.   Prentice Docker, M.D., F.A.C.C.

## 2017-08-01 NOTE — Patient Instructions (Signed)
Medication Instructions:  Continue all current medications.  Labwork:  Lipids, BMET - orders given today.  Office will contact with results via phone or letter.    Testing/Procedures: none  Follow-Up: Your physician wants you to follow up in:  1 year.  You will receive a reminder letter in the mail one-two months in advance.  If you don't receive a letter, please call our office to schedule the follow up appointment   Any Other Special Instructions Will Be Listed Below (If Applicable).  If you need a refill on your cardiac medications before your next appointment, please call your pharmacy.

## 2017-08-24 ENCOUNTER — Telehealth: Payer: Self-pay | Admitting: Cardiovascular Disease

## 2017-08-24 DIAGNOSIS — I25708 Atherosclerosis of coronary artery bypass graft(s), unspecified, with other forms of angina pectoris: Secondary | ICD-10-CM | POA: Diagnosis not present

## 2017-08-24 DIAGNOSIS — E785 Hyperlipidemia, unspecified: Secondary | ICD-10-CM | POA: Diagnosis not present

## 2017-08-24 DIAGNOSIS — I129 Hypertensive chronic kidney disease with stage 1 through stage 4 chronic kidney disease, or unspecified chronic kidney disease: Secondary | ICD-10-CM | POA: Diagnosis not present

## 2017-08-24 DIAGNOSIS — N183 Chronic kidney disease, stage 3 (moderate): Secondary | ICD-10-CM | POA: Diagnosis not present

## 2017-08-24 NOTE — Telephone Encounter (Signed)
Pt was concerned that CKD was listed on his problem list on recent AVS - explained that this has been on problem list and looks to have been stable for at least a year but should f/u with primary provider to see if referral would be needed for nephrology. Also gave lab results of mild elevation of creatinine and potassium and that we would forward results to pcp (used to be Dr Margo Commonapper and pt requested we send to this office) pt voiced understanding and appreciative of call

## 2017-08-24 NOTE — Telephone Encounter (Signed)
-----   Message from Laqueta LindenSuresh A Koneswaran, MD sent at 08/24/2017  8:58 AM EDT ----- Lipids are reasonably controlled.  Creatinine elevated consistent with chronic kidney disease stage III which appears to be stable.  Potassium is mildly elevated.  Please send copy of results to PCP.  He will need further monitoring of both kidney function and potassium.

## 2017-08-24 NOTE — Telephone Encounter (Signed)
Question about AVS given to him on August 01, 2017.

## 2017-08-24 NOTE — Telephone Encounter (Signed)
LM to return call.

## 2018-02-21 DIAGNOSIS — Z23 Encounter for immunization: Secondary | ICD-10-CM | POA: Diagnosis not present

## 2018-08-05 ENCOUNTER — Other Ambulatory Visit: Payer: Self-pay | Admitting: Cardiovascular Disease

## 2018-09-07 ENCOUNTER — Ambulatory Visit: Payer: Medicare Other | Admitting: Cardiovascular Disease

## 2018-10-05 ENCOUNTER — Other Ambulatory Visit: Payer: Self-pay | Admitting: Cardiovascular Disease

## 2018-10-12 ENCOUNTER — Ambulatory Visit: Payer: Medicare Other | Admitting: Cardiovascular Disease

## 2019-01-10 ENCOUNTER — Telehealth: Payer: Self-pay | Admitting: Cardiovascular Disease

## 2019-01-10 ENCOUNTER — Other Ambulatory Visit: Payer: Self-pay

## 2019-01-10 ENCOUNTER — Ambulatory Visit (INDEPENDENT_AMBULATORY_CARE_PROVIDER_SITE_OTHER): Payer: Medicare Other | Admitting: Cardiovascular Disease

## 2019-01-10 ENCOUNTER — Encounter: Payer: Self-pay | Admitting: Cardiovascular Disease

## 2019-01-10 VITALS — BP 128/78 | HR 75 | Ht 72.0 in | Wt 229.0 lb

## 2019-01-10 DIAGNOSIS — E785 Hyperlipidemia, unspecified: Secondary | ICD-10-CM

## 2019-01-10 DIAGNOSIS — R011 Cardiac murmur, unspecified: Secondary | ICD-10-CM | POA: Diagnosis not present

## 2019-01-10 DIAGNOSIS — I25708 Atherosclerosis of coronary artery bypass graft(s), unspecified, with other forms of angina pectoris: Secondary | ICD-10-CM | POA: Diagnosis not present

## 2019-01-10 DIAGNOSIS — N183 Chronic kidney disease, stage 3 unspecified: Secondary | ICD-10-CM

## 2019-01-10 DIAGNOSIS — I10 Essential (primary) hypertension: Secondary | ICD-10-CM

## 2019-01-10 MED ORDER — CARVEDILOL 3.125 MG PO TABS: 3.1250 mg | ORAL_TABLET | Freq: Two times a day (BID) | ORAL | 11 refills | Status: DC

## 2019-01-10 NOTE — Progress Notes (Signed)
SUBJECTIVE: The patient presents for routine follow-up. He has a history of ischemic cardiomyopathy as well as coronary artery disease and underwent CABG in 2011. He also has hypertension, chronic kidney disease stage III, and hyperlipidemia.  He worked in [employer] for over 30 years and occasionally gets bilateral knee pain but it is not activity limiting.  The patient denies any symptoms of chest pain, palpitations, shortness of breath, lightheadedness, dizziness, leg swelling, orthopnea, PND, and syncope.  He walks 1 mile daily.  He eats Cheerios for breakfast every morning.  He drinks a lot of water and pure cranberry juice.  He has chronic constipation and takes a laxative about once a month.    Review of Systems: As per "subjective", otherwise negative.  No Known Allergies  Current Outpatient Medications  Medication Sig Dispense Refill  . amLODipine (NORVASC) 10 MG tablet TAKE 1 TABLET BY MOUTH DAILY 90 tablet 3  . aspirin 81 MG tablet Take 81 mg by mouth daily.      . fish oil-omega-3 fatty acids 1000 MG capsule Take 1 capsule by mouth daily.      Marland Kitchen. lisinopril (ZESTRIL) 20 MG tablet TAKE 1 TABLET BY MOUTH DAILY 90 tablet 3  . Multiple Vitamin (MULTIVITAMIN) tablet Take 1 tablet by mouth daily.      . rosuvastatin (CRESTOR) 10 MG tablet TAKE ONE TABLET BY MOUTH AT BEDTIME 90 tablet 1   No current facility-administered medications for this visit.     Past Medical History:  Diagnosis Date  . Anemia    Operative  . Atrial fibrillation (HCC)    post operative  . Bleeding ulcer   . CAD (coronary artery disease)   . Cardiomyopathy   . Hyperlipidemia   . Hypertension   . Rectal abscess   . Recurrent sinus infections     Past Surgical History:  Procedure Laterality Date  . CORONARY ARTERY BYPASS GRAFT  05/30/2009    Social History   Socioeconomic History  . Marital status: Married    Spouse name: Not on file  . Number of children: Not on file  .  Years of education: Not on file  . Highest education level: Not on file  Occupational History  . 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 Financial resource strain: Not on file  . Food insecurity    Worry: Not on file    Inability: Not on file  . Transportation needs    Medical: Not on file    Non-medical: Not on file  Tobacco Use  . Smoking status: Never Smoker  . Smokeless tobacco: Never Used  Substance and Sexual Activity  . Alcohol use: No  . Drug use: Not on file  . Sexual activity: Not on file  Lifestyle  . Physical activity    Days per week: Not on file    Minutes per session: Not on file  . Stress: Not on file  Relationships  . Social Musicianconnections    Talks on phone: Not on file    Gets together: Not on file    Attends religious service: Not on file    Active member of club or organization: Not on file    Attends meetings of clubs or organizations: Not on file    Relationship status: Not on file  . Intimate partner violence    Fear of current or ex partner: Not on file    Emotionally abused: Not on file  Physically abused: Not on file    Forced sexual activity: Not on file  Other Topics Concern  . Not on file  Social History Narrative   Married    Adult children     Vitals:   01/10/19 1415  BP: 128/78  Pulse: 75  SpO2: 98%  Weight: 245 lb (111.1 kg)  Height: 6' (1.829 m)    Wt Readings from Last 3 Encounters:  01/10/19 245 lb (111.1 kg)  08/01/17 235 lb 3.2 oz (106.7 kg)  07/28/16 226 lb (102.5 kg)     PHYSICAL EXAM General: NAD HEENT: Normal. Neck: No JVD, no thyromegaly. Lungs: Clear to auscultation bilaterally with normal respiratory effort. CV: Regular rate and rhythm, normal S1/S2, no F3/L4, 2/6 systolic murmur along left sternal border. No pretibial or periankle edema.  No carotid bruit.   Abdomen: Soft, nontender, no distention.  Neurologic: Alert and oriented.  Psych: Normal affect. Skin: Normal.  Musculoskeletal: No gross deformities.      Labs: Lab Results  Component Value Date/Time   K 4.2 06/08/2009 03:36 AM   BUN 17 06/08/2009 03:36 AM   CREATININE 1.56 (H) 06/08/2009 03:36 AM   HGB 9.5 (L) 06/08/2009 03:36 AM     Lipids: No results found for: LDLCALC, LDLDIRECT, CHOL, TRIG, HDL     ASSESSMENT AND PLAN: 1. Coronary artery disease with history of CABG: Symptomatically stable. Continue aspirin and statin therapy.  I am switching lisinopril to carvedilol given chronic kidney disease stage III.  2. Essential hypertension: Blood pressure is normal.    I will monitor as I am switching lisinopril to carvedilol.  3. Hyperlipidemia: I will check lipids. Continue Crestor 10 mg daily.  4. CKD stage III: I will check a basic metabolic panel.  I will stop lisinopril.  5.  Cardiac murmur: I will order a 2-D echocardiogram with Doppler to evaluate cardiac structure, function, and regional Puthoff motion.   Disposition: Follow up 1 year   Kate Sable, M.D., F.A.C.C.

## 2019-01-10 NOTE — Telephone Encounter (Signed)
°  Precert needed for: Echo-murmur  Location:  [facility]    Date: oct 14,2020

## 2019-01-10 NOTE — Patient Instructions (Addendum)
Medication Instructions:   Stop Lisinopril.  Begin Coreg 3.125mg  twice a day.    Continue all other current medications.  Labwork:  BMET, Lipids - orders given today.   Reminder:  Nothing to eat or drink after 12 midnight prior to labs.  Please do within the next 4-6 weeks   Office will contact with results via phone or letter.    Testing/Procedures:  Your physician has requested that you have an echocardiogram. Echocardiography is a painless test that uses sound waves to create images of your heart. It provides your doctor with information about the size and shape of your heart and how well your heart's chambers and valves are working. This procedure takes approximately one hour. There are no restrictions for this procedure.  Office will contact with results via phone or letter.    Follow-Up: Your physician wants you to follow up in:  1 year.  You will receive a reminder letter in the mail one-two months in advance.  If you don't receive a letter, please call our office to schedule the follow up appointment   Any Other Special Instructions Will Be Listed Below (If Applicable).  If you need a refill on your cardiac medications before your next appointment, please call your pharmacy.

## 2019-01-31 DIAGNOSIS — E785 Hyperlipidemia, unspecified: Secondary | ICD-10-CM | POA: Diagnosis not present

## 2019-01-31 DIAGNOSIS — N183 Chronic kidney disease, stage 3 (moderate): Secondary | ICD-10-CM | POA: Diagnosis not present

## 2019-01-31 DIAGNOSIS — I129 Hypertensive chronic kidney disease with stage 1 through stage 4 chronic kidney disease, or unspecified chronic kidney disease: Secondary | ICD-10-CM | POA: Diagnosis not present

## 2019-02-06 ENCOUNTER — Encounter: Payer: Self-pay | Admitting: *Deleted

## 2019-02-14 ENCOUNTER — Other Ambulatory Visit: Payer: Medicare Other

## 2019-02-16 DIAGNOSIS — Z23 Encounter for immunization: Secondary | ICD-10-CM | POA: Diagnosis not present

## 2019-02-18 ENCOUNTER — Other Ambulatory Visit: Payer: Self-pay | Admitting: Cardiovascular Disease

## 2019-03-05 DIAGNOSIS — Z23 Encounter for immunization: Secondary | ICD-10-CM | POA: Diagnosis not present

## 2019-03-21 ENCOUNTER — Other Ambulatory Visit: Payer: Medicare Other

## 2019-05-15 DIAGNOSIS — Z23 Encounter for immunization: Secondary | ICD-10-CM | POA: Diagnosis not present

## 2019-06-15 DIAGNOSIS — Z23 Encounter for immunization: Secondary | ICD-10-CM | POA: Diagnosis not present

## 2019-08-27 ENCOUNTER — Other Ambulatory Visit: Payer: Self-pay | Admitting: Cardiovascular Disease

## 2019-09-20 ENCOUNTER — Other Ambulatory Visit: Payer: Self-pay | Admitting: Cardiovascular Disease

## 2019-12-18 ENCOUNTER — Other Ambulatory Visit: Payer: Self-pay | Admitting: *Deleted

## 2019-12-18 MED ORDER — CARVEDILOL 3.125 MG PO TABS: 3.1250 mg | ORAL_TABLET | Freq: Two times a day (BID) | ORAL | 1 refills | Status: DC

## 2020-02-15 ENCOUNTER — Other Ambulatory Visit: Payer: Self-pay | Admitting: Family Medicine

## 2020-02-26 ENCOUNTER — Other Ambulatory Visit: Payer: Self-pay | Admitting: Family Medicine

## 2020-03-08 DIAGNOSIS — Z23 Encounter for immunization: Secondary | ICD-10-CM | POA: Diagnosis not present

## 2020-03-10 NOTE — Progress Notes (Signed)
Cardiology Office Note  Date: 03/11/2020   ID: Larry Castillo, DOB 29-Aug-1940, MRN [id number]  PCP:  Suzan Slick, MD  Cardiologist:  No primary care provider on file. Electrophysiologist:  None   Chief Complaint: Follow-up CAD, CKD, HTN, HLD  History of Present Illness: Larry Castillo is a 79 y.o. male with a history of CAD, CKD, HTN, HLD, ischemic cardiomyopathy.  Last encounter with Dr. Purvis Sheffield 01/10/2019.  Denies any symptoms of chest pain, palpitations, shortness of breath, lightheadedness, dizziness, leg swelling, orthopnea, PND, syncope.  He was walking 1 mile per day.  Blood pressure was well controlled.  CAD was symptomatically stable.  He was continuing aspirin and statin therapy.  He was switched from lisinopril to carvedilol given chronic kidney disease stage III.  He was continuing Crestor 10 mg daily.  Lipid panel was ordered.  BMP was ordered to recheck CKD stage III.  2D echocardiogram was ordered for cardiac murmur.  Patient is here for 1 year follow-up.  Denies any recent acute illnesses or hospitalizations in the interim since last visit.  States he has not seen his PCP in quite some time and has not had any recent lab work in a while.  He denies any anginal or exertional symptoms, palpitations or arrhythmias, orthostatic symptoms, CVA or TIA-like symptoms, PND, orthopnea, bleeding issues.  Denies any claudication-like symptoms, DVT or PE-like symptoms.  Past Medical History:  Diagnosis Date  . Anemia    Operative  . Atrial fibrillation (HCC)    post operative  . Bleeding ulcer   . CAD (coronary artery disease)   . Cardiomyopathy   . Hyperlipidemia   . Hypertension   . Rectal abscess   . Recurrent sinus infections     Past Surgical History:  Procedure Laterality Date  . CORONARY ARTERY BYPASS GRAFT  05/30/2009    Current Outpatient Medications  Medication Sig Dispense Refill  . amLODipine (NORVASC) 10 MG tablet TAKE 1 TABLET BY MOUTH DAILY 90 tablet 1   . aspirin 81 MG tablet Take 81 mg by mouth daily.      Marland Kitchen CALCIUM-VITAMIN D PO Take 1 tablet by mouth daily.    . carvedilol (COREG) 3.125 MG tablet TAKE 1 TABLET BY MOUTH TWICE DAILY 60 tablet 0  . Cyanocobalamin (VITAMIN B-12) 5000 MCG SUBL Place 1 tablet under the tongue daily.    . fish oil-omega-3 fatty acids 1000 MG capsule Take 1 capsule by mouth daily.      . Multiple Vitamin (MULTIVITAMIN) tablet Take 1 tablet by mouth daily.      . rosuvastatin (CRESTOR) 10 MG tablet TAKE 1 TABLET BY MOUTH AT BEDTIME 90 tablet 1   No current facility-administered medications for this visit.   Allergies:  Patient has no known allergies.   Social History: The patient  reports that he has never smoked. He has never used smokeless tobacco. He reports that he does not drink alcohol.   Family History: The patient's family history includes Cancer in his brother; Heart attack in his father; Liver disease in his mother; Suicidality in his brother.   ROS:  Please see the history of present illness. Otherwise, complete review of systems is positive for none.  All other systems are reviewed and negative.   Physical Exam: VS:  BP (!) 158/84 Comment: took BP meds aourd 8 am  Pulse 72   Ht 6' (1.829 m)   Wt 236 lb 6.4 oz (107.2 kg)   SpO2 96%  BMI 32.06 kg/m , BMI Body mass index is 32.06 kg/m.  Wt Readings from Last 3 Encounters:  03/11/20 236 lb 6.4 oz (107.2 kg)  01/10/19 229 lb (103.9 kg)  08/01/17 235 lb 3.2 oz (106.7 kg)    General: Patient appears comfortable at rest. Neck: Supple, no elevated JVP or carotid bruits, no thyromegaly. Lungs: Clear to auscultation, nonlabored breathing at rest. Cardiac: Regular rate and rhythm, no S3, systolic murmur 2/6 best heard at left upper sternal border., no pericardial rub. Extremities: No pitting edema, distal pulses 2+. Skin: Warm and dry. Musculoskeletal: No kyphosis. Neuropsychiatric: Alert and oriented x3, affect grossly appropriate.  ECG:  An  ECG dated 2020-03-11 was personally reviewed today and demonstrated:  Normal sinus rhythm rate of 70, septal infarct age undetermined.  Recent Labwork: No results found for requested labs within last 8760 hours.  No results found for: CHOL, TRIG, HDL, CHOLHDL, VLDL, LDLCALC, LDLDIRECT  Other Studies Reviewed Today:   Assessment and Plan:  1. CAD in native artery   2. Essential hypertension, benign   3. Mixed hyperlipidemia   4. Stage 3 chronic kidney disease, unspecified whether stage 3a or 3b CKD (HCC)   5. Cardiac murmur    1. CAD in native artery History of CABG 2011.  Denies any anginal or exertional symptoms.  Continue aspirin 81 mg daily.  2. Essential hypertension, benign Blood pressure elevated today on arrival at 158/84.  Patient states blood pressure at home systolic runs in the 130s to 140s.  Continue carvedilol 3.125 mg p.o. twice daily.  Please get a CBC and basic metabolic panel.  Patient requesting labs.  States he has not had lab work at PCP in quite some time.  Continue amlodipine 10 mg daily.  3. Mixed hyperlipidemia Continue Crestor 10 mg daily.  Please get a lipid panel.  4. Stage 3 chronic kidney disease, unspecified whether stage 3a or 3b CKD (HCC) Renal function labs September 2020 creatinine 1.43, GFR 48.  Please get repeat basic metabolic panel  5. Cardiac murmur Patient has a 2/6 systolic ejection murmur best heard at left upper sternal border.  Please get an echocardiogram to assess LV function, diastolic function, valvular function.  Medication Adjustments/Labs and Tests Ordered: Current medicines are reviewed at length with the patient today.  Concerns regarding medicines are outlined above.   Disposition: Follow-up with Dr. Wyline Mood or APP 1 year.  Signed, Rennis Harding, NP 03/11/2020 10:40 AM    [facility] at [facility] [address] [place], [place], [place] [address] Phone: [phone]; Fax: [phone]

## 2020-03-11 ENCOUNTER — Encounter: Payer: Self-pay | Admitting: Family Medicine

## 2020-03-11 ENCOUNTER — Ambulatory Visit (INDEPENDENT_AMBULATORY_CARE_PROVIDER_SITE_OTHER): Payer: Medicare Other | Admitting: Family Medicine

## 2020-03-11 VITALS — BP 158/84 | HR 72 | Ht 72.0 in | Wt 236.4 lb

## 2020-03-11 DIAGNOSIS — I2510 Atherosclerotic heart disease of native coronary artery without angina pectoris: Secondary | ICD-10-CM

## 2020-03-11 DIAGNOSIS — I10 Essential (primary) hypertension: Secondary | ICD-10-CM

## 2020-03-11 DIAGNOSIS — E782 Mixed hyperlipidemia: Secondary | ICD-10-CM | POA: Diagnosis not present

## 2020-03-11 DIAGNOSIS — R011 Cardiac murmur, unspecified: Secondary | ICD-10-CM

## 2020-03-11 DIAGNOSIS — N183 Chronic kidney disease, stage 3 unspecified: Secondary | ICD-10-CM | POA: Diagnosis not present

## 2020-03-11 NOTE — Patient Instructions (Signed)
Medication Instructions:  Continue all current medications.  Labwork:  CBC, BMET, Lipids - orders given today.   Office will contact with results via phone or letter.    Testing/Procedures:  Your physician has requested that you have an echocardiogram. Echocardiography is a painless test that uses sound waves to create images of your heart. It provides your doctor with information about the size and shape of your heart and how well your heart's chambers and valves are working. This procedure takes approximately one hour. There are no restrictions for this procedure.  Office will contact with results via phone or letter.    Follow-Up: Your physician wants you to follow up in:  1 year.  You will receive a reminder letter in the mail one-two months in advance.  If you don't receive a letter, please call our office to schedule the follow up appointment.  Any Other Special Instructions Will Be Listed Below (If Applicable).  If you need a refill on your cardiac medications before your next appointment, please call your pharmacy.

## 2020-03-11 NOTE — Addendum Note (Signed)
Addended by: Lesle Chris on: 03/11/2020 02:55 PM   Modules accepted: Orders

## 2020-03-12 DIAGNOSIS — Z23 Encounter for immunization: Secondary | ICD-10-CM | POA: Diagnosis not present

## 2020-03-17 DIAGNOSIS — I2510 Atherosclerotic heart disease of native coronary artery without angina pectoris: Secondary | ICD-10-CM | POA: Diagnosis not present

## 2020-03-20 ENCOUNTER — Other Ambulatory Visit: Payer: Self-pay | Admitting: Family Medicine

## 2020-03-20 ENCOUNTER — Other Ambulatory Visit: Payer: Self-pay | Admitting: Cardiology

## 2020-03-26 ENCOUNTER — Other Ambulatory Visit: Payer: Self-pay | Admitting: Family Medicine

## 2020-04-05 ENCOUNTER — Other Ambulatory Visit: Payer: Self-pay | Admitting: Family Medicine

## 2020-04-10 ENCOUNTER — Ambulatory Visit (INDEPENDENT_AMBULATORY_CARE_PROVIDER_SITE_OTHER): Payer: Medicare Other

## 2020-04-10 ENCOUNTER — Telehealth: Payer: Self-pay | Admitting: Family Medicine

## 2020-04-10 DIAGNOSIS — R011 Cardiac murmur, unspecified: Secondary | ICD-10-CM | POA: Diagnosis not present

## 2020-04-10 LAB — ECHOCARDIOGRAM COMPLETE
AR max vel: 1.34 cm2
AV Area VTI: 1.27 cm2
AV Area mean vel: 1.3 cm2
AV Mean grad: 12 mmHg
AV Peak grad: 24.5 mmHg
Ao pk vel: 2.47 m/s
Calc EF: 57.1 %
S' Lateral: 4.58 cm
Single Plane A2C EF: 51.8 %
Single Plane A4C EF: 63.6 %

## 2020-04-10 NOTE — Telephone Encounter (Signed)
Patient called requesting recent labs

## 2020-04-14 ENCOUNTER — Encounter: Payer: Self-pay | Admitting: *Deleted

## 2020-04-14 ENCOUNTER — Telehealth: Payer: Self-pay | Admitting: *Deleted

## 2020-04-14 NOTE — Telephone Encounter (Signed)
-----   Message from Netta Neat., NP sent at 04/11/2020  1:01 PM EST ----- Please call the patient and let him know the pumping function of his heart looks good.  He has a mild leak in his mitral valve.  This is not uncommon with the aging process.  Has some mild aortic valve narrowing.  Please let him know we will keep an eye on this by doing yearly echocardiograms.  Q

## 2020-04-14 NOTE — Telephone Encounter (Signed)
Patient informed. Copy sent to PCP °

## 2020-04-17 ENCOUNTER — Telehealth: Payer: Self-pay | Admitting: *Deleted

## 2020-04-17 NOTE — Telephone Encounter (Signed)
-----   Message from Netta Neat., NP sent at 04/15/2020  5:05 PM EST ----- Lipid panel looks good except for LDL still remains above goal at 78.  Ask patient if he would be willing to increase his Crestor to 20 mg so we can get that number down.  If so go ahead and order the increased dose of Crestor at 20 mg daily.  CBC was normal everything looks good.  Creatinine was 1.49 which is a little bit above normal.  GFR was 44.  This looks pretty close to baseline for him.  Last year his creatinine was 1.43 and his GFR was 48 on previous lab work ordered by Dr. Purvis Sheffield in September 2020.  Thanks

## 2020-04-18 MED ORDER — ROSUVASTATIN CALCIUM 20 MG PO TABS: 20.0000 mg | ORAL_TABLET | Freq: Every day | ORAL | 3 refills | Status: DC

## 2020-04-18 NOTE — Telephone Encounter (Signed)
Patient informed and agrees to crestor 20 mg. Copy sent to PCP

## 2020-09-19 ENCOUNTER — Other Ambulatory Visit: Payer: Self-pay | Admitting: Family Medicine

## 2020-09-19 ENCOUNTER — Other Ambulatory Visit: Payer: Self-pay | Admitting: Cardiology

## 2020-10-31 DEATH — deceased

## 2021-03-24 ENCOUNTER — Other Ambulatory Visit: Payer: Self-pay | Admitting: Family Medicine

## 2021-04-20 ENCOUNTER — Other Ambulatory Visit: Payer: Self-pay | Admitting: *Deleted

## 2021-04-21 MED ORDER — AMLODIPINE BESYLATE 10 MG PO TABS: 10.0000 mg | ORAL_TABLET | Freq: Every day | ORAL | 0 refills | Status: DC

## 2021-04-21 MED ORDER — CARVEDILOL 3.125 MG PO TABS: 3.1250 mg | ORAL_TABLET | Freq: Two times a day (BID) | ORAL | 0 refills | Status: DC

## 2021-04-21 MED ORDER — ROSUVASTATIN CALCIUM 20 MG PO TABS: 20.0000 mg | ORAL_TABLET | Freq: Every day | ORAL | 0 refills | Status: DC

## 2021-04-28 ENCOUNTER — Encounter: Payer: Self-pay | Admitting: Internal Medicine

## 2021-04-28 ENCOUNTER — Other Ambulatory Visit: Payer: Self-pay

## 2021-04-28 ENCOUNTER — Ambulatory Visit (INDEPENDENT_AMBULATORY_CARE_PROVIDER_SITE_OTHER): Payer: Medicare Other | Admitting: Internal Medicine

## 2021-04-28 VITALS — BP 150/80 | HR 70 | Ht 72.0 in | Wt 236.4 lb

## 2021-04-28 DIAGNOSIS — I2510 Atherosclerotic heart disease of native coronary artery without angina pectoris: Secondary | ICD-10-CM

## 2021-04-28 DIAGNOSIS — I350 Nonrheumatic aortic (valve) stenosis: Secondary | ICD-10-CM | POA: Diagnosis not present

## 2021-04-28 DIAGNOSIS — Z951 Presence of aortocoronary bypass graft: Secondary | ICD-10-CM | POA: Diagnosis not present

## 2021-04-28 DIAGNOSIS — E782 Mixed hyperlipidemia: Secondary | ICD-10-CM | POA: Diagnosis not present

## 2021-04-28 DIAGNOSIS — I10 Essential (primary) hypertension: Secondary | ICD-10-CM

## 2021-04-28 NOTE — Patient Instructions (Signed)
Medication Instructions:  Your physician recommends that you continue on your current medications as directed. Please refer to the Current Medication list given to you today.  *If you need a refill on your cardiac medications before your next appointment, please call your pharmacy*   Lab Work: CMET LIPID If you have labs (blood work) drawn today and your tests are completely normal, you will receive your results only by: MyChart Message (if you have MyChart) OR A paper copy in the mail If you have any lab test that is abnormal or we need to change your treatment, we will call you to review the results.   Testing/Procedures: Your physician has requested that you have an echocardiogram. Echocardiography is a painless test that uses sound waves to create images of your heart. It provides your doctor with information about the size and shape of your heart and how well your hearts chambers and valves are working. This procedure takes approximately one hour. There are no restrictions for this procedure.    Follow-Up: At [facility], you and your health needs are our priority.  As part of our continuing mission to provide you with exceptional heart care, we have created designated Provider Care Teams.  These Care Teams include your primary Cardiologist (physician) and Advanced Practice Providers (APPs -  Physician Assistants and Nurse Practitioners) who all work together to provide you with the care you need, when you need it.  We recommend signing up for the patient portal called "MyChart".  Sign up information is provided on this After Visit Summary.  MyChart is used to connect with patients for Virtual Visits (Telemedicine).  Patients are able to view lab/test results, encounter notes, upcoming appointments, etc.  Non-urgent messages can be sent to your provider as well.   To learn more about what you can do with MyChart, go to [email or web address].    Your next appointment:   1  year(s)  The format for your next appointment:   In Person  Provider:   Zoila Shutter, MD     Other Instructions

## 2021-04-28 NOTE — Progress Notes (Signed)
Cardiology Office Note  Date: 04/28/2021   ID: Larry Castillo, DOB 09/07/1940, MRN [id number]  PCP:  Suzan Slick, MD  Cardiologist:  None Electrophysiologist:  None   Chief Complaint: Follow-up CAD, CKD, HTN, HLD  History of Present Illness: Larry Castillo is a 80 y.o. male with a history of CAD, CKD, HTN, HLD, ischemic cardiomyopathy.  Last encounter with Dr. Purvis Sheffield 01/10/2019.  Denies any symptoms of chest pain, palpitations, shortness of breath, lightheadedness, dizziness, leg swelling, orthopnea, PND, syncope.  He was walking 1 mile per day.  Blood pressure was well controlled.  CAD was symptomatically stable.  He was continuing aspirin and statin therapy.  He was switched from lisinopril to carvedilol given chronic kidney disease stage III.  He was continuing Crestor 10 mg daily.  Lipid panel was ordered.  BMP was ordered to recheck CKD stage III.  2D echocardiogram was ordered for cardiac murmur.  04/28/2021  Larry Castillo returns today for follow-up.  Overall he says he is doing well.  Last year he was seen by Rennis Harding, NP.  He was noted to have a murmur and an echocardiogram was ordered.  This demonstrated normal LVEF 55 to 60% however he was found to have some mild to moderate aortic stenosis with a mean valve gradient of 12 mmHg and some mild mitral regurgitation.  Labs performed last year also showed total cholesterol of 161, HDL 74, triglycerides 44 and LDL 78.  His target LDL is less than 70.  He is due for repeat labs as well.  Past Medical History:  Diagnosis Date   Anemia    Operative   Atrial fibrillation (HCC)    post operative   Bleeding ulcer    CAD (coronary artery disease)    Cardiomyopathy    Hyperlipidemia    Hypertension    Rectal abscess    Recurrent sinus infections     Past Surgical History:  Procedure Laterality Date   CORONARY ARTERY BYPASS GRAFT  05/30/2009    Current Outpatient Medications  Medication Sig Dispense Refill   amLODipine  (NORVASC) 10 MG tablet Take 1 tablet (10 mg total) by mouth daily. 30 tablet 0   aspirin 81 MG tablet Take 81 mg by mouth daily.       CALCIUM-VITAMIN D PO Take 1 tablet by mouth daily.     carvedilol (COREG) 3.125 MG tablet Take 1 tablet (3.125 mg total) by mouth 2 (two) times daily. 60 tablet 0   Cyanocobalamin (VITAMIN B-12) 5000 MCG SUBL Place 1 tablet under the tongue daily.     fish oil-omega-3 fatty acids 1000 MG capsule Take 1 capsule by mouth daily.       Multiple Vitamin (MULTIVITAMIN) tablet Take 1 tablet by mouth daily.       rosuvastatin (CRESTOR) 20 MG tablet Take 1 tablet (20 mg total) by mouth daily. 30 tablet 0   No current facility-administered medications for this visit.   Allergies:  Patient has no known allergies.   Social History: The patient  reports that he has never smoked. He has never used smokeless tobacco. He reports that he does not drink alcohol.   Family History: The patient's family history includes Cancer in his brother; Heart attack in his father; Liver disease in his mother; Suicidality in his brother.   ROS:  Please see the history of present illness. Otherwise, complete review of systems is positive for none.  All other systems are reviewed and negative.  Physical Exam: VS:  BP (!) 150/80    Pulse 70    Ht 6' (1.829 m)    Wt 236 lb 6.4 oz (107.2 kg)    SpO2 95%    BMI 32.06 kg/m , BMI Body mass index is 32.06 kg/m.  Wt Readings from Last 3 Encounters:  04/28/21 236 lb 6.4 oz (107.2 kg)  03/11/20 236 lb 6.4 oz (107.2 kg)  01/10/19 229 lb (103.9 kg)    General appearance: alert and no distress Neck: no carotid bruit, no JVD, and thyroid not enlarged, symmetric, no tenderness/mass/nodules Lungs: clear to auscultation bilaterally Heart: regular rate and rhythm, S1, S2 normal, and systolic murmur: systolic ejection 3/6, crescendo at 2nd right intercostal space Abdomen: soft, non-tender; bowel sounds normal; no masses,  no organomegaly Extremities:  extremities normal, atraumatic, no cyanosis or edema Pulses: 2+ and symmetric Skin: Skin color, texture, turgor normal. No rashes or lesions Neurologic: Grossly normal Psych: Pleasant   ECG: Normal sinus rhythm at 70-personally reviewed  Recent Labwork: No results found for requested labs within last 8760 hours.  No results found for: CHOL, TRIG, HDL, CHOLHDL, VLDL, LDLCALC, LDLDIRECT  Other Studies Reviewed Today:   Assessment and Plan: CAD status post CABG x6 in 2011 Hypertension Dyslipidemia Aortic stenosis CKD 3  1. CAD in native artery History of CABG 2011.  Denies any anginal or exertional symptoms.  Continue aspirin 81 mg daily along with statin and carvedilol.  2. Essential hypertension, benign Blood pressure elevated today on arrival at 150/80 -then improved to 128/80.  Has a high korotkoff sound. Continue carvedilol 3.125 mg p.o. twice daily and amlodipine 10 mg daily. Will repeat CMET.  3. Mixed hyperlipidemia Continue Crestor 20 mg daily.  Last LDL was 78 - repeat FLP.  4. Stage 3 chronic kidney disease, unspecified whether stage 3a or 3b CKD (HCC) Last creatinine was 1.49, GFR 44.  Please get repeat comprehensive metabolic panel  5. Aortic stenosis Noted to have mild to moderate aortic stenosis with a mean gradient of 12 mmHg in 2021.  He is due for repeat echo which will obtain today.  LV function was normal.    Disposition: Follow-up in 1 year or sooner as necessary.  Pixie Casino, MD, [facility], [place] Director of the Advanced Lipid Disorders &  Cardiovascular Risk Reduction Clinic Diplomate of the American Board of Clinical Lipidology Attending Cardiologist  Direct Dial: [phone]   Fax: [phone]  Website:  [email or web address]

## 2021-05-05 ENCOUNTER — Other Ambulatory Visit (HOSPITAL_COMMUNITY)
Admission: RE | Admit: 2021-05-05 | Discharge: 2021-05-05 | Disposition: A | Discharge status: Home / Self Care | Payer: Medicare Other | Source: Ambulatory Visit | Attending: Internal Medicine | Admitting: Internal Medicine

## 2021-05-05 DIAGNOSIS — E782 Mixed hyperlipidemia: Secondary | ICD-10-CM | POA: Diagnosis present

## 2021-05-05 DIAGNOSIS — I10 Essential (primary) hypertension: Secondary | ICD-10-CM | POA: Diagnosis present

## 2021-05-05 LAB — COMPREHENSIVE METABOLIC PANEL
ALT: 24 U/L (ref 0–44)
AST: 19 U/L (ref 15–41)
Albumin: 4.1 g/dL (ref 3.5–5.0)
Alkaline Phosphatase: 63 U/L (ref 38–126)
Anion gap: 10 (ref 5–15)
BUN: 26 mg/dL — ABNORMAL HIGH (ref 8–23)
CO2: 25 mmol/L (ref 22–32)
Calcium: 9.4 mg/dL (ref 8.9–10.3)
Chloride: 103 mmol/L (ref 98–111)
Creatinine, Ser: 1.41 mg/dL — ABNORMAL HIGH (ref 0.61–1.24)
GFR, Estimated: 50 mL/min — ABNORMAL LOW (ref >60)
Glucose, Bld: 103 mg/dL — ABNORMAL HIGH (ref 70–99)
Potassium: 4 mmol/L (ref 3.5–5.1)
Sodium: 138 mmol/L (ref 135–145)
Total Bilirubin: 0.7 mg/dL (ref 0.3–1.2)
Total Protein: 7.7 g/dL (ref 6.5–8.1)

## 2021-05-05 LAB — LIPID PANEL
Cholesterol: 153 mg/dL (ref 0–200)
HDL: 64 mg/dL (ref >40)
LDL Cholesterol: 77 mg/dL (ref 0–99)
Total CHOL/HDL Ratio: 2.4 RATIO
Triglycerides: 58 mg/dL (ref <150)
VLDL: 12 mg/dL (ref 0–40)

## 2021-05-07 ENCOUNTER — Other Ambulatory Visit: Payer: Self-pay | Admitting: Internal Medicine

## 2021-05-07 MED ORDER — CARVEDILOL 3.125 MG PO TABS: 3.1250 mg | ORAL_TABLET | Freq: Two times a day (BID) | ORAL | 3 refills | Status: DC

## 2021-05-07 MED ORDER — AMLODIPINE BESYLATE 10 MG PO TABS: 10.0000 mg | ORAL_TABLET | Freq: Every day | ORAL | 3 refills | Status: DC

## 2021-05-07 MED ORDER — ROSUVASTATIN CALCIUM 20 MG PO TABS: 20.0000 mg | ORAL_TABLET | Freq: Every day | ORAL | 3 refills | Status: DC

## 2021-06-02 ENCOUNTER — Ambulatory Visit (HOSPITAL_COMMUNITY)
Admission: RE | Admit: 2021-06-02 | Discharge: 2021-06-02 | Disposition: A | Discharge status: Home / Self Care | Payer: Medicare Other | Source: Ambulatory Visit | Attending: Internal Medicine | Admitting: Internal Medicine

## 2021-06-02 ENCOUNTER — Other Ambulatory Visit: Payer: Self-pay

## 2021-06-02 DIAGNOSIS — I350 Nonrheumatic aortic (valve) stenosis: Secondary | ICD-10-CM | POA: Insufficient documentation

## 2021-06-02 LAB — ECHOCARDIOGRAM COMPLETE
AR max vel: 1.26 cm2
AV Area VTI: 1.2 cm2
AV Area mean vel: 1.26 cm2
AV Mean grad: 25.3 mmHg
AV Peak grad: 40.8 mmHg
Ao pk vel: 3.19 m/s
Area-P 1/2: 3.12 cm2
S' Lateral: 3.7 cm

## 2021-06-02 NOTE — Progress Notes (Signed)
*  PRELIMINARY RESULTS* Echocardiogram 2D Echocardiogram has been performed.  Larry Castillo 06/02/2021, 9:32 AM

## 2022-02-17 NOTE — H&P (Signed)
Surgical History & Physical  Patient Name: Larry Castillo  DOB: May 20, 1940  Surgery: Cataract extraction with intraocular lens implant phacoemulsification; Right Eye Surgeon: Ronn Melena MD Surgery Date: 02/26/2022 Pre-Op Date: 02/16/2022  HPI: A 62 Yr. old male patient present for cataract eval per [name]. 1. 1. The patient complains of difficulty when viewing TV, reading closed caption, news scrolls on TV, which began 2 years ago. Both eyes are affected. The episode is gradual. The condition's severity is worsening. This is negatively affecting the patient's quality of life and the patient is unable to function adequately in life with the current level of vision. About 5 years ago he noticed on the TV there was a "dip" in vision OS. He believes he seen [name]. He was told he had a crease in his lens.  Medical History: Cataracts ERM OS, Asteroid Hyalosis OD  Heart Problem High Blood Pressure LDL kidney disease stage 3  Review of Systems Negative Allergic/Immunologic Negative Cardiovascular Negative Constitutional Negative Ear, Nose, Mouth & Throat Negative Endocrine Negative Eyes Negative Gastrointestinal Negative Genitourinary Negative Hemotologic/Lymphatic Negative Integumentary Negative Musculoskeletal Negative Neurological Negative Psychiatry Negative Respiratory  Social Never smoked  Medication Amlodipine, Carvedilol, Rosuvastatin, Asprin  Sx/Procedures Coronary Bypass x6  Drug Allergies  NKDA  History & Physical: Heent: Cataracts, ERM OS, Asteroid Hyalosis OD NECK: supple without bruits LUNGS: lungs clear to auscultation CV: regular rate and rhythm Abdomen: soft and non-tender  Impression & Plan: Assessment: 1.  Hyperopia ; Both Eyes (H52.03) 2.  glaucoma PRIMARY OPEN ANGLE GLAUCOMA; Right Eye Indeterminate (P59.4585) 3.  CATARACT AGE-RELATED NUCLEAR; Both Eyes (H25.13) 4.  DRY EYE SYNDROME/TEAR FILM INSUFFICIENCY; Both Eyes  (H04.123) 5.  Epiretinal Membrane; Left Eye (H35.372)  Plan: 1.  Monitor for now.  2.  IOP elevated today with 1+ APD OD and asymmetric cupping. OCT RNFL with apparent thinning OD. Needs full work up after CE. Will start cosopt BID OD and recheck in 1 week.  3.  Cataracts are visually significant and account for the patient's complaints. Discussed all risks, benefits, procedures and recovery, including infection, loss of vision and eye, need for glasses after surgery or additional procedures. Patient understands changing glasses will not improve vision. Patient indicated understanding of procedure. All questions answered. Patient desires to have surgery, recommend phacoemulsification with intraocular lens. Patient to have preliminary testing necessary (Argos/IOL Master, Mac OCT, TOPO) Educational materials provided. Plan: - Target -0.25 OU with OD first - OS with ERM limiting acuity  4.  Findings, prognosis and treatment options reviewed. Begin basic treatment regimen; warm compresses, lid scrubs, 1000-2017m omega 3 fatty acids, and preserved artificial tears QID.  5.  Symptomatic. Known for at least 6 years and [place]. View now limited by cataract but discussed possible referral back to retina after cataract is removed to attempt better vision. Findings, prognosis and treatment options reviewed.

## 2022-02-22 ENCOUNTER — Encounter (HOSPITAL_COMMUNITY)
Admission: RE | Admit: 2022-02-22 | Discharge: 2022-02-22 | Disposition: A | Discharge status: Home / Self Care | Payer: Medicare Other | Source: Ambulatory Visit | Attending: Optometry | Admitting: Optometry

## 2022-02-22 ENCOUNTER — Encounter (HOSPITAL_COMMUNITY): Payer: Self-pay

## 2022-02-26 ENCOUNTER — Ambulatory Visit (HOSPITAL_COMMUNITY): Payer: Medicare Other | Admitting: Certified Registered Nurse Anesthetist

## 2022-02-26 ENCOUNTER — Encounter (HOSPITAL_COMMUNITY)
Admission: RE | Disposition: A | Discharge status: Home / Self Care | Payer: Self-pay | Source: Ambulatory Visit | Attending: Optometry

## 2022-02-26 ENCOUNTER — Ambulatory Visit (HOSPITAL_BASED_OUTPATIENT_CLINIC_OR_DEPARTMENT_OTHER): Payer: Medicare Other | Admitting: Certified Registered Nurse Anesthetist

## 2022-02-26 ENCOUNTER — Ambulatory Visit (HOSPITAL_COMMUNITY)
Admission: RE | Admit: 2022-02-26 | Discharge: 2022-02-26 | Disposition: A | Discharge status: Home / Self Care | Payer: Medicare Other | Source: Ambulatory Visit | Attending: Optometry | Admitting: Optometry

## 2022-02-26 ENCOUNTER — Encounter (HOSPITAL_COMMUNITY): Payer: Self-pay | Admitting: Optometry

## 2022-02-26 DIAGNOSIS — H2511 Age-related nuclear cataract, right eye: Secondary | ICD-10-CM | POA: Diagnosis present

## 2022-02-26 DIAGNOSIS — H5203 Hypermetropia, bilateral: Secondary | ICD-10-CM | POA: Diagnosis not present

## 2022-02-26 DIAGNOSIS — H401114 Primary open-angle glaucoma, right eye, indeterminate stage: Secondary | ICD-10-CM | POA: Diagnosis not present

## 2022-02-26 DIAGNOSIS — H04123 Dry eye syndrome of bilateral lacrimal glands: Secondary | ICD-10-CM | POA: Diagnosis not present

## 2022-02-26 DIAGNOSIS — H25811 Combined forms of age-related cataract, right eye: Secondary | ICD-10-CM | POA: Diagnosis not present

## 2022-02-26 DIAGNOSIS — I2510 Atherosclerotic heart disease of native coronary artery without angina pectoris: Secondary | ICD-10-CM

## 2022-02-26 DIAGNOSIS — H35372 Puckering of macula, left eye: Secondary | ICD-10-CM | POA: Insufficient documentation

## 2022-02-26 DIAGNOSIS — D649 Anemia, unspecified: Secondary | ICD-10-CM | POA: Diagnosis not present

## 2022-02-26 DIAGNOSIS — I10 Essential (primary) hypertension: Secondary | ICD-10-CM | POA: Diagnosis not present

## 2022-02-26 HISTORY — PX: CATARACT EXTRACTION W/PHACO: SHX586

## 2022-02-26 SURGERY — PHACOEMULSIFICATION, CATARACT, WITH IOL INSERTION
Anesthesia: Monitor Anesthesia Care | Site: Eye | Laterality: Right

## 2022-02-26 MED ORDER — POVIDONE-IODINE 5 % OP SOLN
OPHTHALMIC | Status: DC | PRN
  Administered 2022-02-26: 1 via OPHTHALMIC

## 2022-02-26 MED ORDER — MIDAZOLAM HCL 2 MG/2ML IJ SOLN
INTRAMUSCULAR | Status: AC
  Filled 2022-02-26: qty 2

## 2022-02-26 MED ORDER — PHENYLEPHRINE-KETOROLAC 1-0.3 % IO SOLN
INTRAOCULAR | Status: DC | PRN
  Administered 2022-02-26: 500 mL via OPHTHALMIC

## 2022-02-26 MED ORDER — STERILE WATER FOR IRRIGATION IR SOLN
Status: DC | PRN
  Administered 2022-02-26: 250 mL

## 2022-02-26 MED ORDER — TROPICAMIDE 1 % OP SOLN
1.0000 [drp] | OPHTHALMIC | Status: AC
  Administered 2022-02-26 (×3): 1 [drp] via OPHTHALMIC

## 2022-02-26 MED ORDER — MIDAZOLAM HCL 2 MG/2ML IJ SOLN
INTRAMUSCULAR | Status: DC | PRN
Start: 2022-02-26 — End: 2022-02-26
  Administered 2022-02-26: 1 mg via INTRAVENOUS

## 2022-02-26 MED ORDER — TETRACAINE HCL 0.5 % OP SOLN
1.0000 [drp] | OPHTHALMIC | Status: AC
  Administered 2022-02-26 (×3): 1 [drp] via OPHTHALMIC

## 2022-02-26 MED ORDER — PHENYLEPHRINE HCL 2.5 % OP SOLN
1.0000 [drp] | OPHTHALMIC | Status: AC
  Administered 2022-02-26 (×3): 1 [drp] via OPHTHALMIC

## 2022-02-26 MED ORDER — PHENYLEPHRINE-KETOROLAC 1-0.3 % IO SOLN
INTRAOCULAR | Status: AC
  Filled 2022-02-26: qty 4

## 2022-02-26 MED ORDER — BSS IO SOLN
INTRAOCULAR | Status: DC | PRN
  Administered 2022-02-26: 15 mL via INTRAOCULAR

## 2022-02-26 MED ORDER — LIDOCAINE HCL (PF) 1 % IJ SOLN
INTRAMUSCULAR | Status: DC | PRN
  Administered 2022-02-26: 1 mL

## 2022-02-26 MED ORDER — SIGHTPATH DOSE#1 NA HYALUR & NA CHOND-NA HYALUR IO KIT
PACK | INTRAOCULAR | Status: DC | PRN
  Administered 2022-02-26: 1 via OPHTHALMIC

## 2022-02-26 MED ORDER — MOXIFLOXACIN HCL 5 MG/ML IO SOLN
INTRAOCULAR | Status: AC
  Filled 2022-02-26: qty 1

## 2022-02-26 MED ORDER — LIDOCAINE HCL 3.5 % OP GEL
1.0000 | Freq: Once | OPHTHALMIC | Status: AC
  Administered 2022-02-26: 1 via OPHTHALMIC

## 2022-02-26 MED ORDER — MOXIFLOXACIN HCL 0.5 % OP SOLN
OPHTHALMIC | Status: DC | PRN
  Administered 2022-02-26: 1 mL via OPHTHALMIC

## 2022-02-26 SURGICAL SUPPLY — 16 items
CATARACT SUITE SIGHTPATH (MISCELLANEOUS) ×1 IMPLANT
CLOTH BEACON ORANGE TIMEOUT ST (SAFETY) ×1 IMPLANT
DRSG TEGADERM 4X4.75 (GAUZE/BANDAGES/DRESSINGS) ×1 IMPLANT
EYE SHIELD UNIVERSAL CLEAR (GAUZE/BANDAGES/DRESSINGS) IMPLANT
FEE CATARACT SUITE SIGHTPATH (MISCELLANEOUS) ×1 IMPLANT
GLOVE BIOGEL PI IND STRL 7.0 (GLOVE) ×2 IMPLANT
LENS IOL RAYNER 22.5 (Intraocular Lens) ×1 IMPLANT
LENS IOL RAYONE EMV 22.5 (Intraocular Lens) IMPLANT
NDL HYPO 18GX1.5 BLUNT FILL (NEEDLE) ×1 IMPLANT
NEEDLE HYPO 18GX1.5 BLUNT FILL (NEEDLE) ×1 IMPLANT
PAD ARMBOARD 7.5X6 YLW CONV (MISCELLANEOUS) ×1 IMPLANT
RING MALYGIN 7.0 (MISCELLANEOUS) IMPLANT
SYR TB 1ML LL NO SAFETY (SYRINGE) ×1 IMPLANT
TAPE SURG TRANSPORE 1 IN (GAUZE/BANDAGES/DRESSINGS) IMPLANT
TAPE SURGICAL TRANSPORE 1 IN (GAUZE/BANDAGES/DRESSINGS) ×1
WATER STERILE IRR 250ML POUR (IV SOLUTION) ×1 IMPLANT

## 2022-02-26 NOTE — Anesthesia Preprocedure Evaluation (Signed)
Anesthesia Evaluation  Patient identified by MRN, date of birth, ID band Patient awake    Reviewed: Allergy & Precautions, H&P , NPO status , Patient's Chart, lab work & pertinent test results, reviewed documented beta blocker date and time   Airway Mallampati: II  TM Distance: >3 FB Neck ROM: full    Dental no notable dental hx.    Pulmonary neg pulmonary ROS,    Pulmonary exam normal breath sounds clear to auscultation       Cardiovascular Exercise Tolerance: Good hypertension, + CAD and + CABG   Rhythm:regular Rate:Normal     Neuro/Psych negative neurological ROS  negative psych ROS   GI/Hepatic negative GI ROS, Neg liver ROS,   Endo/Other  negative endocrine ROS  Renal/GU negative Renal ROS  negative genitourinary   Musculoskeletal   Abdominal   Peds  Hematology  (+) Blood dyscrasia, anemia ,   Anesthesia Other Findings   Reproductive/Obstetrics negative OB ROS                             Anesthesia Physical Anesthesia Plan  ASA: 3  Anesthesia Plan: MAC   Post-op Pain Management:    Induction:   PONV Risk Score and Plan:   Airway Management Planned:   Additional Equipment:   Intra-op Plan:   Post-operative Plan:   Informed Consent: I have reviewed the patients History and Physical, chart, labs and discussed the procedure including the risks, benefits and alternatives for the proposed anesthesia with the patient or authorized representative who has indicated his/her understanding and acceptance.     Dental Advisory Given  Plan Discussed with: CRNA  Anesthesia Plan Comments:         Anesthesia Quick Evaluation

## 2022-02-26 NOTE — Transfer of Care (Signed)
Immediate Anesthesia Transfer of Care Note  Patient: Larry Castillo  Procedure(s) Performed: CATARACT EXTRACTION PHACO AND INTRAOCULAR LENS PLACEMENT (IOC) (Right: Eye)  Patient Location: Short Stay  Anesthesia Type:MAC  Level of Consciousness: awake, alert  and oriented  Airway & Oxygen Therapy: Patient Spontanous Breathing  Post-op Assessment: Report given to RN and Post -op Vital signs reviewed and stable  Post vital signs: Reviewed and stable  Last Vitals:  Vitals Value Taken Time  BP 152/79 02/26/22 0849  Temp 36.5 C 02/26/22 0849  Pulse 59 02/26/22 0849  Resp 17 02/26/22 0849  SpO2 100 % 02/26/22 0849    Last Pain:  Vitals:   02/26/22 0849  TempSrc: Oral  PainSc: 0-No pain         Complications: No notable events documented.

## 2022-02-26 NOTE — Discharge Instructions (Signed)
Please discharge patient when stable, will follow up today with Dr. Amariss Detamore at the [facility] office immediately following discharge.  Leave shield in place until visit.  All paperwork with discharge instructions will be given at the office.  [facility] Address:  [address]  [place], [place] [address]  

## 2022-02-26 NOTE — Anesthesia Postprocedure Evaluation (Signed)
Anesthesia Post Note  Patient: Larry Castillo  Procedure(s) Performed: CATARACT EXTRACTION PHACO AND INTRAOCULAR LENS PLACEMENT (IOC) (Right: Eye)  Patient location during evaluation: Phase II Anesthesia Type: MAC Level of consciousness: awake Pain management: pain level controlled Vital Signs Assessment: post-procedure vital signs reviewed and stable Respiratory status: spontaneous breathing and respiratory function stable Cardiovascular status: blood pressure returned to baseline and stable Postop Assessment: no headache and no apparent nausea or vomiting Anesthetic complications: no Comments: Late entry   No notable events documented.   Last Vitals:  Vitals:   02/26/22 0745 02/26/22 0849  BP: (!) 164/66 (!) 152/79  Pulse: (!) 55 (!) 59  Resp: 18 17  Temp:  36.5 C  SpO2: 98% 100%    Last Pain:  Vitals:   02/26/22 0849  TempSrc: Oral  PainSc: 0-No pain                 Louann Sjogren

## 2022-02-26 NOTE — Interval H&P Note (Signed)
History and Physical Interval Note:  02/26/2022 8:17 AM  The H and P was reviewed and updated. The patient was examined.  No changes were found after exam.  The surgical eye was marked.    Larry Castillo

## 2022-02-26 NOTE — Op Note (Signed)
Date of procedure: 02/26/22  Pre-operative diagnosis: Visually significant age-related nuclear cataract, Right Eye (H25.11)  Post-operative diagnosis: Visually significant age-related nuclear cataract, Right Eye  Procedure: Removal of cataract via phacoemulsification and insertion of intra-ocular lens Rayner [id number] +22.5D into the capsular bag of the Right Eye  Attending surgeon: Ronn Melena, MD  Anesthesia: MAC, Topical Akten  Complications: None  Estimated Blood Loss: <53m (minimal)  Specimens: None  Implants:  Implant Name Type Inv. Item Serial No. Manufacturer Lot No. LRB No. Used Action  LENS IOL RAYNER 22.5 - [id number] Lens LENS IOL RAYNER 22.5  [employer] [id number] 1 Implanted    Indications:  Visually significant age-related cataract, Right Eye  Procedure:  The patient was seen and identified in the pre-operative area. The operative eye was identified and dilated.  The operative eye was marked.  Topical anesthesia was administered to the operative eye.     The patient was then to the operative suite and placed in the supine position.  A timeout was performed confirming the patient, procedure to be performed, and all other relevant information.   The patient's face was prepped and draped in the usual fashion for intra-ocular surgery.  A lid speculum was placed into the operative eye and the surgical microscope moved into place and focused.  A superotemporal paracentesis was created using a 20 gauge paracentesis blade.  BSS mixed with Omidria, followed by 1% lidocaine was injected into the anterior chamber.  Viscoelastic was injected into the anterior chamber.  A temporal clear-corneal main wound incision was created using a 2.465mmicrokeratome.  A continuous curvilinear capsulorrhexis was initiated using an irrigating cystitome and completed using capsulorrhexis forceps.  Hydrodissection and hydrodeliniation were performed.  Viscoelastic was injected into  the anterior chamber.  A phacoemulsification handpiece and a chopper as a second instrument were used to remove the nucleus and epinucleus. The irrigation/aspiration handpiece was used to remove any remaining cortical material.   The capsular bag was reinflated with viscoelastic, checked, and found to be intact.  The intraocular lens was inserted into the capsular bag.  The irrigation/aspiration handpiece was used to remove any remaining viscoelastic.  The clear corneal wound and paracentesis wounds were then hydrated and checked with Weck-Cels to be watertight.  The lid-speculum and drape was removed, and the patient's face was cleaned with a wet and dry 4x4.  Moxifloxacin drops were instilled onto the eye. A clear shield was taped over the eye. The patient was taken to the post-operative care unit in good condition, having tolerated the procedure well.  Post-Op Instructions: [facility] and will receive all other orders and instructions.

## 2022-03-02 ENCOUNTER — Encounter (HOSPITAL_COMMUNITY): Payer: Self-pay | Admitting: Optometry

## 2022-03-03 NOTE — Addendum Note (Signed)
Addendum  created 03/03/22 1130 by Karna Dupes, CRNA   Intraprocedure Staff edited

## 2022-03-09 ENCOUNTER — Encounter (HOSPITAL_COMMUNITY): Payer: Medicare Other

## 2022-03-12 ENCOUNTER — Ambulatory Visit (HOSPITAL_COMMUNITY): Admission: RE | Admit: 2022-03-12 | Payer: Medicare Other | Source: Home / Self Care | Admitting: Optometry

## 2022-03-12 ENCOUNTER — Encounter (HOSPITAL_COMMUNITY): Admission: RE | Payer: Self-pay | Source: Home / Self Care

## 2022-03-12 SURGERY — PHACOEMULSIFICATION, CATARACT, WITH IOL INSERTION
Anesthesia: Monitor Anesthesia Care | Laterality: Left

## 2022-05-04 ENCOUNTER — Ambulatory Visit: Payer: Medicare Other | Attending: Internal Medicine | Admitting: Internal Medicine

## 2022-05-04 ENCOUNTER — Encounter: Payer: Self-pay | Admitting: Internal Medicine

## 2022-05-04 VITALS — BP 144/80 | HR 70 | Ht 72.0 in | Wt 228.2 lb

## 2022-05-04 DIAGNOSIS — H401113 Primary open-angle glaucoma, right eye, severe stage: Secondary | ICD-10-CM | POA: Diagnosis not present

## 2022-05-04 DIAGNOSIS — Z79899 Other long term (current) drug therapy: Secondary | ICD-10-CM

## 2022-05-04 DIAGNOSIS — E782 Mixed hyperlipidemia: Secondary | ICD-10-CM

## 2022-05-04 DIAGNOSIS — I2510 Atherosclerotic heart disease of native coronary artery without angina pectoris: Secondary | ICD-10-CM

## 2022-05-04 DIAGNOSIS — I10 Essential (primary) hypertension: Secondary | ICD-10-CM

## 2022-05-04 DIAGNOSIS — I350 Nonrheumatic aortic (valve) stenosis: Secondary | ICD-10-CM

## 2022-05-04 NOTE — Patient Instructions (Addendum)
Medication Instructions:  Your physician recommends that you continue on your current medications as directed. Please refer to the Current Medication list given to you today.  Labwork: Your physician recommends that you return for a FASTING lipid, CMET, CBC. Please do not eat or drink for at least 8 hours when you have this done. You may take your medications that morning with a sip of water. [employer] or [employer] ([place] [place])  Testing/Procedures: Your physician has requested that you have an echocardiogram. Echocardiography is a painless test that uses sound waves to create images of your heart. It provides your doctor with information about the size and shape of your heart and how well your heart's chambers and valves are working. This procedure takes approximately one hour. There are no restrictions for this procedure. Please do NOT wear cologne, perfume, aftershave, or lotions (deodorant is allowed). Please arrive 15 minutes prior to your appointment time.  Follow-Up: Your physician recommends that you schedule a follow-up appointment in: 1 year. You will receive a reminder call in the mail in about 10 months reminding you to call and schedule your appointment. If you don't receive this call, please contact our office.  Any Other Special Instructions Will Be Listed Below (If Applicable).  If you need a refill on your cardiac medications before your next appointment, please call your pharmacy.

## 2022-05-04 NOTE — Progress Notes (Signed)
Cardiology Office Note  Date: 05/04/2022   ID: MOURAD CWIKLA, DOB 1940/09/15, MRN [id number]  PCP:  Leeanne Rio, MD  Cardiologist:  Chalmers Guest, MD Electrophysiologist:  None   Reason for Office Visit: Follow-up of CAD and moderate AS   History of Present Illness: Larry Castillo is a 82 y.o. male known to have CAD s/p CABG in 2011, HLD, HTN, postop A-fib not on AC, moderate aortic stenosis, CKD presented to cardiology clinic for follow-up visit.  Patient underwent echocardiogram in 2023 which showed moderate aortic valve stenosis, mild aortic regurgitation and normal LVEF. He denied having angina, DOE, dizziness, lightheadedness, syncope, LE swelling and palpitations.  Denies smoking cigarettes, alcohol use and illicit drug abuse. He is compliant with all his medications and has no side effects.  No bleeding complications. He likes to work and is active all day.  Past Medical History:  Diagnosis Date   Anemia    Operative   Atrial fibrillation ([place])    post operative   Bleeding ulcer    CAD (coronary artery disease)    Cardiomyopathy    Hyperlipidemia    Hypertension    Rectal abscess    Recurrent sinus infections     Past Surgical History:  Procedure Laterality Date   CATARACT EXTRACTION W/PHACO Right 02/26/2022   Procedure: CATARACT EXTRACTION PHACO AND INTRAOCULAR LENS PLACEMENT ([place]);  Surgeon: Ronn Melena, MD;  Location: [facility];  Service: Ophthalmology;  Laterality: Right;  CDE 7.63   CORONARY ARTERY BYPASS GRAFT  05/30/2009    Current Outpatient Medications  Medication Sig Dispense Refill   amLODipine (NORVASC) 10 MG tablet Take 1 tablet (10 mg total) by mouth daily. 90 tablet 3   aspirin 81 MG tablet Take 81 mg by mouth daily.       CALCIUM-VITAMIN D PO Take 1 tablet by mouth daily.     carvedilol (COREG) 3.125 MG tablet Take 1 tablet (3.125 mg total) by mouth 2 (two) times daily. 180 tablet 3   Cyanocobalamin (VITAMIN B-12) 5000 MCG SUBL  Place 1 tablet under the tongue daily.     fish oil-omega-3 fatty acids 1000 MG capsule Take 1 capsule by mouth daily.       Multiple Vitamin (MULTIVITAMIN) tablet Take 1 tablet by mouth daily.       rosuvastatin (CRESTOR) 20 MG tablet Take 1 tablet (20 mg total) by mouth daily. 90 tablet 3   No current facility-administered medications for this visit.   Allergies:  Patient has no known allergies.   Social History: The patient  reports that he has never smoked. He has never used smokeless tobacco. He reports that he does not drink alcohol and does not use drugs.   Family History: The patient's family history includes Cancer in his brother; Heart attack in his father; Liver disease in his mother; Suicidality in his brother.   ROS:  Please see the history of present illness. Otherwise, complete review of systems is positive for none.  All other systems are reviewed and negative.   Physical Exam: VS:  BP (!) 144/80   Pulse 70   Ht 6' (1.829 m)   Wt 228 lb 3.2 oz (103.5 kg)   SpO2 97%   BMI 30.95 kg/m , BMI Body mass index is 30.95 kg/m.  Wt Readings from Last 3 Encounters:  05/04/22 228 lb 3.2 oz (103.5 kg)  02/22/22 230 lb (104.3 kg)  04/28/21 236 lb 6.4 oz (107.2 kg)  General: Patient appears comfortable at rest. HEENT: Conjunctiva and lids normal, oropharynx clear with moist mucosa. Neck: Supple, no elevated JVP or carotid bruits, no thyromegaly. Lungs: Clear to auscultation, nonlabored breathing at rest. Cardiac: Regular rate and rhythm, ejection systolic murmur with presence of S2 Abdomen: Soft, nontender, no hepatomegaly, bowel sounds present, no guarding or rebound. Extremities: No pitting edema, distal pulses 2+. Skin: Warm and dry. Musculoskeletal: No kyphosis. Neuropsychiatric: Alert and oriented x3, affect grossly appropriate.  ECG: Normal sinus rhythm with no ST changes  Recent Labwork: 05/05/2021: ALT 24; AST 19; BUN 26; Creatinine, Ser 1.41; Potassium 4.0;  Sodium 138     Component Value Date/Time   CHOL 153 05/05/2021 0816   TRIG 58 05/05/2021 0816   HDL 64 05/05/2021 0816   CHOLHDL 2.4 05/05/2021 0816   VLDL 12 05/05/2021 0816   LDLCALC 77 05/05/2021 0816    Other Studies Reviewed Today: Echocardiogram from 2023 January LVEF normal Moderate aortic stenosis  Assessment and Plan: Patient is a 82 year old M known to have CAD s/p CABG in 2011, HLD, HTN, postop A-fib not on AC, moderate aortic stenosis, CKD presented to cardiology clinic for follow-up visit.  # CAD s/p CABG in 2011, currently angina free -Continue aspirin 81 mg once daily -Continue rosuvastatin 20 mg nightly -Continue carvedilol 3.125 mg twice daily -Not on ACE/ARB due to CKD but not indicated in chronic CAD  # Moderate aortic valve stenosis (per 1/23 echo), asymptomatic # Mild aortic valve regurgitation -Obtain 2D echocardiogram, yearly surveillance for aortic valve stenosis  # HLD, not at goal -Continue rosuvastatin 20 mg nightly.  Obtain lipid panel.  # HTN, controlled -Continue carvedilol 3.125 mg twice daily.  Obtain routine labs of CBC and CMP.  I have spent a total of 33 minutes with patient reviewing chart, EKGs, labs and examining patient as well as establishing an assessment and plan that was discussed with the patient.  > 50% of time was spent in direct patient care.      Medication Adjustments/Labs and Tests Ordered: Current medicines are reviewed at length with the patient today.  Concerns regarding medicines are outlined above.   Tests Ordered: Orders Placed This Encounter  Procedures   Comprehensive metabolic panel   Lipid panel   CBC   EKG 12-Lead   ECHOCARDIOGRAM COMPLETE    Medication Changes: No orders of the defined types were placed in this encounter.   Disposition:  Follow up  1 year  Signed Katalaya Beel Fidel Levy, MD, 05/04/2022 5:19 PM    [facility] [place], [place], [place]  [id number]

## 2022-05-05 ENCOUNTER — Other Ambulatory Visit: Payer: Self-pay | Admitting: Internal Medicine

## 2022-05-05 DIAGNOSIS — E782 Mixed hyperlipidemia: Secondary | ICD-10-CM

## 2022-05-05 DIAGNOSIS — I10 Essential (primary) hypertension: Secondary | ICD-10-CM

## 2022-05-05 DIAGNOSIS — I2510 Atherosclerotic heart disease of native coronary artery without angina pectoris: Secondary | ICD-10-CM

## 2022-05-05 DIAGNOSIS — I350 Nonrheumatic aortic (valve) stenosis: Secondary | ICD-10-CM

## 2022-05-05 DIAGNOSIS — Z79899 Other long term (current) drug therapy: Secondary | ICD-10-CM

## 2022-05-11 ENCOUNTER — Ambulatory Visit: Payer: Medicare Other | Attending: Internal Medicine

## 2022-05-11 DIAGNOSIS — I10 Essential (primary) hypertension: Secondary | ICD-10-CM | POA: Diagnosis not present

## 2022-05-11 DIAGNOSIS — I350 Nonrheumatic aortic (valve) stenosis: Secondary | ICD-10-CM | POA: Diagnosis not present

## 2022-05-11 DIAGNOSIS — I2510 Atherosclerotic heart disease of native coronary artery without angina pectoris: Secondary | ICD-10-CM

## 2022-05-11 LAB — ECHOCARDIOGRAM COMPLETE
AR max vel: 1.19 cm2
AV Area VTI: 1.24 cm2
AV Area mean vel: 1.26 cm2
AV Mean grad: 23.5 mmHg
AV Peak grad: 41.4 mmHg
AV Vena cont: 0.3 cm
Ao pk vel: 3.22 m/s
Area-P 1/2: 4.49 cm2
Calc EF: 57 %
MV M vel: 5.6 m/s
MV Peak grad: 125.4 mmHg
P 1/2 time: 715 msec
Radius: 0.3 cm
S' Lateral: 3.8 cm
Single Plane A2C EF: 55.2 %
Single Plane A4C EF: 55.9 %

## 2022-05-14 ENCOUNTER — Other Ambulatory Visit: Payer: Self-pay | Admitting: Internal Medicine

## 2022-05-21 ENCOUNTER — Telehealth: Payer: Self-pay | Admitting: *Deleted

## 2022-05-21 DIAGNOSIS — N1831 Chronic kidney disease, stage 3a: Secondary | ICD-10-CM

## 2022-05-21 MED ORDER — EZETIMIBE 10 MG PO TABS: 10.0000 mg | ORAL_TABLET | Freq: Every day | ORAL | 3 refills | Status: DC

## 2022-05-21 NOTE — Telephone Encounter (Signed)
Patient informed and verbalized understanding of plan. 

## 2022-05-21 NOTE — Telephone Encounter (Signed)
-----  Message from Chalmers Guest, MD sent at 05/21/2022  8:01 AM EST ----- Serum creatinine is elevated at 1.55 with GFR 45 indicating CKD stage IIIa.  He will need to establish care with nephrology for CKD management. Place nephrology referral. In addition, LDL is 81 and not at goal.  Start Zetia 10 mg once daily in addition to rosuvastatin 20 mg nightly.  Goal LDL is less than 70.  Follow-up in 1 year.

## 2022-05-25 ENCOUNTER — Telehealth: Payer: Self-pay | Admitting: Internal Medicine

## 2022-05-25 NOTE — Telephone Encounter (Signed)
Informed patient that he should be taking both medications.  He verbalized understanding.   See below note from Dr. Dellia Cloud -   - Message from Chalmers Guest, MD sent at 05/21/2022  8:01 AM EST ----- Serum creatinine is elevated at 1.55 with GFR 45 indicating CKD stage IIIa.  He will need to establish care with nephrology for CKD management. Place nephrology referral. In addition, LDL is 81 and not at goal.  Start Zetia 10 mg once daily in addition to rosuvastatin 20 mg nightly.  Goal LDL is less than 70.  Follow-up in 1 year.

## 2022-05-25 NOTE — Telephone Encounter (Signed)
Pt would like to know if he is supposed to be taking the Zetia 10mg  and the Crestor 20mg  together.   Please call [phone]

## 2022-06-03 NOTE — H&P (Signed)
Surgical History & Physical  Patient Name: Larry Castillo  DOB: [birth year]  Surgery: Cataract extraction with intraocular lens implant phacoemulsification; Left Eye Surgeon: Ronn Melena MD Surgery Date: 06/11/2022 Pre-Op Date: 05/04/2022  HPI: A 25 Yr. old male patient 1. The patient is returning for an intraocular pressure check 1 month follow-up of both eyes. Since the last visit, the affected area is doing well. Patient is following medication instructions: Timolol BID OD.  Patient would like to schedule cataract sx OS before having glasses made. Patient is still having difficulties reading captions on TV and reading fine print. Vision appears hazy and blurred. This is negatively affecting the patient's quality of life and the patient is unable to function adequately in life with the current level of vision.  Medical History: Cataracts ERM OS, Asteroid Hyalosis OD Heart Problem High Blood Pressure LDL kidney disease stage 3  Review Of Systems Negative Allergic/Immunologic Negative Cardiovascular Negative Constitutional Negative Ear, Nose, Mouth & Throat Negative Endocrine Negative Eyes Negative Gastrointestinal Negative Genitourinary Negative Hemotologic/Lymphatic Negative Integumentary Negative Musculoskeletal Negative Neurological Negative Psychiatry Negative Respiratory  Social Never smoked   Medication Timolol, Refresh artificial tears,  Amlodipine, Carvedilol, Rosuvastatin, Asprin  Sx/Procedures Phaco c IOL OD,  Coronary Bypass x6  Drug Allergies  NKDA  History & Physical: Heent: cataract OS, PCL OD NECK: supple without bruits LUNGS: lungs clear to auscultation CV: regular rate and rhythm Abdomen: soft and non-tender  Impression & Plan: Assessment: 1.  CATARACT EXTRACTION STATUS; Right Eye (Z98.41) 2.  Hyperopia ; Both Eyes (H52.03) 3.  glaucoma PRIMARY OPEN ANGLE GLAUCOMA; Right Eye Severe (H40.1113) 4.  CATARACT AGE-RELATED NUCLEAR; , Left  Eye (H25.12) 5.  DRY EYE SYNDROME/TEAR FILM INSUFFICIENCY; Both Eyes (H04.123) 6.  Epiretinal Membrane; Left Eye (H35.372)  Plan: 1.  S/p CE/PCIOL OD on 02/26/22. K was slow to clear but now compact. Vision limited by glaucoma and K PEE at this time.  - Continue ATs QID and warm compresses daily - New MRx dispensed but patient will wait for surgery OS  2.  Monitor for now  3.  Glaucoma: - Suspect POAG though only in OD, making optic neuropathy possible. Initial IOP was 30, has been well controlled since starting timolol. - IOP elevated with 1+ APD OD and asymmetric cupping.  Current gtts: timolol BID OD Intolerances: None Surgical Hx: CE/PCIOL OD 02/26/22  Family Hx: None  Tmax: 30 OD prior to starting drops Last DFE:  OCT (04/06/22): OD: RNFL 54, with superior and inferior thinning OS: RNFL 90 with inferior thinning  VF 24-2 (05/04/22) OD: dense superior and inferior scotoma with central island remaining OS: central scotoma, periphery intact throughout  4.  Cataracts are visually significant and account for the patient's complaints. Discussed all risks, benefits, procedures and recovery, including infection, loss of vision and eye, need for glasses after surgery or additional procedures. Patient understands changing glasses will not improve vision. Patient indicated understanding of procedure. All questions answered. Patient desires to have surgery, recommend phacoemulsification with intraocular lens. Patient to have preliminary testing necessary (Argos/IOL Master, Mac OCT, TOPO) Educational materials provided.  Plan: - Proceed with cataract surgery OS when ready - RayOne lens, target -0.25 - Ok with lying flat, no apparent fuchs though had edema OD - vision limited by ERM and discussed referral to Dr. Coralyn Pear afterward  5.  Findings, prognosis and treatment options reviewed. Begin basic treatment regimen; warm compresses, lid scrubs, 1000-2042m omega 3 fatty acids, and  preserved artificial tears QID.  6.  Symptomatic. Known for at least 6 years and previously followed by Dr. Rodena Piety in [place] when patient elected to monitor. View now limited by cataract but discussed possible referral back to retina after cataract is removed to attempt better vision. Findings, prognosis and treatment options reviewed.

## 2022-06-08 ENCOUNTER — Encounter (HOSPITAL_COMMUNITY)
Admission: RE | Admit: 2022-06-08 | Discharge: 2022-06-08 | Disposition: A | Discharge status: Home / Self Care | Payer: 59 | Source: Ambulatory Visit | Attending: Optometry | Admitting: Optometry

## 2022-06-08 DIAGNOSIS — H2512 Age-related nuclear cataract, left eye: Secondary | ICD-10-CM | POA: Diagnosis not present

## 2022-06-08 NOTE — Pre-Procedure Instructions (Signed)
Attempted pre-op phone call on both cell and home phone. No Voicemail on either line.

## 2022-06-09 ENCOUNTER — Encounter (HOSPITAL_COMMUNITY): Payer: Self-pay

## 2022-06-09 ENCOUNTER — Other Ambulatory Visit: Payer: Self-pay

## 2022-06-11 ENCOUNTER — Ambulatory Visit (HOSPITAL_COMMUNITY)
Admission: RE | Admit: 2022-06-11 | Discharge: 2022-06-11 | Disposition: A | Discharge status: Home / Self Care | Payer: 59 | Attending: Optometry | Admitting: Optometry

## 2022-06-11 ENCOUNTER — Encounter (HOSPITAL_COMMUNITY): Payer: Self-pay | Admitting: Optometry

## 2022-06-11 ENCOUNTER — Ambulatory Visit (HOSPITAL_BASED_OUTPATIENT_CLINIC_OR_DEPARTMENT_OTHER): Payer: 59 | Admitting: Certified Registered Nurse Anesthetist

## 2022-06-11 ENCOUNTER — Encounter (HOSPITAL_COMMUNITY)
Admission: RE | Disposition: A | Discharge status: Home / Self Care | Payer: Self-pay | Source: Home / Self Care | Attending: Optometry

## 2022-06-11 ENCOUNTER — Ambulatory Visit (HOSPITAL_COMMUNITY): Payer: 59 | Admitting: Certified Registered Nurse Anesthetist

## 2022-06-11 DIAGNOSIS — H401113 Primary open-angle glaucoma, right eye, severe stage: Secondary | ICD-10-CM | POA: Insufficient documentation

## 2022-06-11 DIAGNOSIS — I10 Essential (primary) hypertension: Secondary | ICD-10-CM | POA: Insufficient documentation

## 2022-06-11 DIAGNOSIS — D649 Anemia, unspecified: Secondary | ICD-10-CM | POA: Insufficient documentation

## 2022-06-11 DIAGNOSIS — H2512 Age-related nuclear cataract, left eye: Secondary | ICD-10-CM | POA: Diagnosis not present

## 2022-06-11 DIAGNOSIS — Z9841 Cataract extraction status, right eye: Secondary | ICD-10-CM | POA: Insufficient documentation

## 2022-06-11 DIAGNOSIS — Z951 Presence of aortocoronary bypass graft: Secondary | ICD-10-CM | POA: Diagnosis not present

## 2022-06-11 DIAGNOSIS — H25812 Combined forms of age-related cataract, left eye: Secondary | ICD-10-CM

## 2022-06-11 DIAGNOSIS — H5203 Hypermetropia, bilateral: Secondary | ICD-10-CM | POA: Diagnosis not present

## 2022-06-11 DIAGNOSIS — I2510 Atherosclerotic heart disease of native coronary artery without angina pectoris: Secondary | ICD-10-CM

## 2022-06-11 DIAGNOSIS — H43312 Vitreous membranes and strands, left eye: Secondary | ICD-10-CM | POA: Diagnosis not present

## 2022-06-11 DIAGNOSIS — H04129 Dry eye syndrome of unspecified lacrimal gland: Secondary | ICD-10-CM | POA: Insufficient documentation

## 2022-06-11 HISTORY — PX: CATARACT EXTRACTION W/PHACO: SHX586

## 2022-06-11 SURGERY — PHACOEMULSIFICATION, CATARACT, WITH IOL INSERTION
Anesthesia: Monitor Anesthesia Care | Site: Eye | Laterality: Left

## 2022-06-11 MED ORDER — LACTATED RINGERS IV SOLN: INTRAVENOUS | Status: DC

## 2022-06-11 MED ORDER — LIDOCAINE HCL 3.5 % OP GEL
1.0000 | Freq: Once | OPHTHALMIC | Status: AC
  Administered 2022-06-11: 1 via OPHTHALMIC

## 2022-06-11 MED ORDER — SIGHTPATH DOSE#1 NA HYALUR & NA CHOND-NA HYALUR IO KIT
PACK | INTRAOCULAR | Status: DC | PRN
  Administered 2022-06-11: 1 via OPHTHALMIC

## 2022-06-11 MED ORDER — PHENYLEPHRINE-KETOROLAC 1-0.3 % IO SOLN
INTRAOCULAR | Status: DC | PRN
  Administered 2022-06-11: 500 mL via OPHTHALMIC

## 2022-06-11 MED ORDER — PHENYLEPHRINE HCL 2.5 % OP SOLN
1.0000 [drp] | OPHTHALMIC | Status: AC | PRN
  Administered 2022-06-11 (×3): 1 [drp] via OPHTHALMIC

## 2022-06-11 MED ORDER — MIDAZOLAM HCL 5 MG/5ML IJ SOLN
INTRAMUSCULAR | Status: DC | PRN
  Administered 2022-06-11: 1 mg via INTRAVENOUS

## 2022-06-11 MED ORDER — SODIUM CHLORIDE 0.9% FLUSH
INTRAVENOUS | Status: DC | PRN
  Administered 2022-06-11: 5 mL via INTRAVENOUS

## 2022-06-11 MED ORDER — MOXIFLOXACIN HCL 5 MG/ML IO SOLN
INTRAOCULAR | Status: AC
  Filled 2022-06-11: qty 1

## 2022-06-11 MED ORDER — MOXIFLOXACIN HCL 0.5 % OP SOLN
OPHTHALMIC | Status: DC | PRN
  Administered 2022-06-11: .2 mL via OPHTHALMIC

## 2022-06-11 MED ORDER — FENTANYL CITRATE (PF) 100 MCG/2ML IJ SOLN
INTRAMUSCULAR | Status: DC | PRN
  Administered 2022-06-11: 50 ug via INTRAVENOUS

## 2022-06-11 MED ORDER — BSS IO SOLN
INTRAOCULAR | Status: DC | PRN
  Administered 2022-06-11: 15 mL via INTRAOCULAR

## 2022-06-11 MED ORDER — PHENYLEPHRINE-KETOROLAC 1-0.3 % IO SOLN
INTRAOCULAR | Status: AC
  Filled 2022-06-11: qty 4

## 2022-06-11 MED ORDER — LIDOCAINE HCL (PF) 1 % IJ SOLN
INTRAMUSCULAR | Status: DC | PRN
  Administered 2022-06-11: 1 mL

## 2022-06-11 MED ORDER — TROPICAMIDE 1 % OP SOLN
1.0000 [drp] | OPHTHALMIC | Status: AC | PRN
  Administered 2022-06-11 (×3): 1 [drp] via OPHTHALMIC

## 2022-06-11 MED ORDER — POVIDONE-IODINE 5 % OP SOLN
OPHTHALMIC | Status: DC | PRN
  Administered 2022-06-11: 1 via OPHTHALMIC

## 2022-06-11 MED ORDER — FENTANYL CITRATE (PF) 100 MCG/2ML IJ SOLN
INTRAMUSCULAR | Status: AC
  Filled 2022-06-11: qty 2

## 2022-06-11 MED ORDER — TETRACAINE HCL 0.5 % OP SOLN
1.0000 [drp] | OPHTHALMIC | Status: AC | PRN
  Administered 2022-06-11 (×3): 1 [drp] via OPHTHALMIC

## 2022-06-11 MED ORDER — MIDAZOLAM HCL 2 MG/2ML IJ SOLN
INTRAMUSCULAR | Status: AC
  Filled 2022-06-11: qty 2

## 2022-06-11 MED ORDER — STERILE WATER FOR IRRIGATION IR SOLN
Status: DC | PRN
  Administered 2022-06-11: 250 mL

## 2022-06-11 SURGICAL SUPPLY — 16 items
CATARACT SUITE SIGHTPATH (MISCELLANEOUS) ×1 IMPLANT
CLOTH BEACON ORANGE TIMEOUT ST (SAFETY) ×1 IMPLANT
DRSG TEGADERM 4X4.75 (GAUZE/BANDAGES/DRESSINGS) ×1 IMPLANT
EYE SHIELD UNIVERSAL CLEAR (GAUZE/BANDAGES/DRESSINGS) IMPLANT
FEE CATARACT SUITE SIGHTPATH (MISCELLANEOUS) ×1 IMPLANT
GLOVE BIOGEL PI IND STRL 7.0 (GLOVE) ×2 IMPLANT
LENS IOL RAYNER 22.5 (Intraocular Lens) ×1 IMPLANT
LENS IOL RAYONE EMV 22.5 (Intraocular Lens) IMPLANT
NDL HYPO 18GX1.5 BLUNT FILL (NEEDLE) ×1 IMPLANT
NEEDLE HYPO 18GX1.5 BLUNT FILL (NEEDLE) ×1 IMPLANT
PAD ARMBOARD 7.5X6 YLW CONV (MISCELLANEOUS) ×1 IMPLANT
RING MALYGIN 7.0 (MISCELLANEOUS) IMPLANT
SYR TB 1ML LL NO SAFETY (SYRINGE) ×1 IMPLANT
TAPE SURG TRANSPORE 1 IN (GAUZE/BANDAGES/DRESSINGS) IMPLANT
TAPE SURGICAL TRANSPORE 1 IN (GAUZE/BANDAGES/DRESSINGS) ×1
WATER STERILE IRR 250ML POUR (IV SOLUTION) ×1 IMPLANT

## 2022-06-11 NOTE — Discharge Instructions (Signed)
Please discharge patient when stable, will follow up today with Dr. Allyne Hebert at the [facility] [place] office immediately following discharge.  Leave shield in place until visit.  All paperwork with discharge instructions will be given at the office.  [facility] Address:  [address]  [place], [place] [address]  Dr. Gertha Lichtenberg's Phone: [phone]  

## 2022-06-11 NOTE — Interval H&P Note (Signed)
History and Physical Interval Note:  06/11/2022 11:18 AM  The H and P was reviewed and updated. The patient was examined.  No changes were found after exam.  The surgical eye was marked.  Javonn Gauger

## 2022-06-11 NOTE — Op Note (Signed)
Date of procedure: 06/11/22  Pre-operative diagnosis: Visually significant age-related nuclear cataract, Left Eye (H25.12)  Post-operative diagnosis: Visually significant age-related nuclear cataract, Left Eye  Procedure: Removal of cataract via phacoemulsification and insertion of intra-ocular lens Rayner [id number] +22.5D into the capsular bag of the Left Eye  Attending surgeon: Bryon Lions, MD  Anesthesia: MAC, Topical Akten  Complications: None  Estimated Blood Loss: <45m (minimal)  Specimens: None  Implants:  Implant Name Type Inv. Item Serial No. Manufacturer Lot No. LRB No. Used Action  LENS IOL RAYNER 22.5 - S42 Intraocular Lens LENS IOL RAYNER 22.5 42 SIGHTPATH [id number] Implanted    Indications:  Visually significant age-related cataract, Left Eye  Procedure:  The patient was seen and identified in the pre-operative area. The operative eye was identified and dilated.  The operative eye was marked.  Topical anesthesia was administered to the operative eye.     The patient was then to the operative suite and placed in the supine position.  A timeout was performed confirming the patient, procedure to be performed, and all other relevant information.   The patient's face was prepped and draped in the usual fashion for intra-ocular surgery.  A lid speculum was placed into the operative eye and the surgical microscope moved into place and focused.  An inferotemporal paracentesis was created using a 20 gauge paracentesis blade.  BSS mixed with Omidria, followed by 1% lidocaine was injected into the anterior chamber.  Viscoelastic was injected into the anterior chamber.  A temporal clear-corneal main wound incision was created using a 2.453mmicrokeratome.  A continuous curvilinear capsulorrhexis was initiated using an irrigating cystitome and completed using capsulorrhexis forceps.  Hydrodissection and hydrodeliniation were performed.  Viscoelastic was injected into the  anterior chamber.  A phacoemulsification handpiece and a chopper as a second instrument were used to remove the nucleus and epinucleus. The irrigation/aspiration handpiece was used to remove any remaining cortical material.   The capsular bag was reinflated with viscoelastic, checked, and found to be intact.  The intraocular lens was inserted into the capsular bag.  The irrigation/aspiration handpiece was used to remove any remaining viscoelastic.  The clear corneal wound and paracentesis wounds were then hydrated and checked with Weck-Cels to be watertight.  The lid-speculum and drape was removed, and the patient's face was cleaned with a wet and dry 4x4.  Moxifloxacin was instilled onto the eye. A clear shield was taped over the eye. The patient was taken to the post-operative care unit in good condition, having tolerated the procedure well.  Post-Op Instructions: [facility] and will receive all other orders and instructions.

## 2022-06-11 NOTE — Anesthesia Preprocedure Evaluation (Signed)
Anesthesia Evaluation  Patient identified by MRN, date of birth, ID band Patient awake    Reviewed: Allergy & Precautions, H&P , NPO status , Patient's Chart, lab work & pertinent test results, reviewed documented beta blocker date and time   Airway Mallampati: II  TM Distance: >3 FB Neck ROM: full    Dental no notable dental hx.    Pulmonary neg pulmonary ROS   Pulmonary exam normal breath sounds clear to auscultation       Cardiovascular Exercise Tolerance: Good hypertension, + CAD and + CABG   Rhythm:regular Rate:Normal     Neuro/Psych negative neurological ROS  negative psych ROS   GI/Hepatic negative GI ROS, Neg liver ROS,,,  Endo/Other  negative endocrine ROS    Renal/GU negative Renal ROS  negative genitourinary   Musculoskeletal   Abdominal   Peds  Hematology  (+) Blood dyscrasia, anemia   Anesthesia Other Findings   Reproductive/Obstetrics negative OB ROS                              Anesthesia Physical Anesthesia Plan  ASA: 3  Anesthesia Plan: MAC   Post-op Pain Management:    Induction:   PONV Risk Score and Plan:   Airway Management Planned:   Additional Equipment:   Intra-op Plan:   Post-operative Plan:   Informed Consent: I have reviewed the patients History and Physical, chart, labs and discussed the procedure including the risks, benefits and alternatives for the proposed anesthesia with the patient or authorized representative who has indicated his/her understanding and acceptance.     Dental Advisory Given  Plan Discussed with: CRNA  Anesthesia Plan Comments:          Anesthesia Quick Evaluation

## 2022-06-11 NOTE — Transfer of Care (Signed)
Immediate Anesthesia Transfer of Care Note  Patient: Larry Castillo  Procedure(s) Performed: CATARACT EXTRACTION PHACO AND INTRAOCULAR LENS PLACEMENT (IOC) (Left: Eye)  Patient Location: PACU  Anesthesia Type:MAC  Level of Consciousness: awake, alert , and oriented  Airway & Oxygen Therapy: Patient Spontanous Breathing  Post-op Assessment: Report given to RN, Post -op Vital signs reviewed and stable, Patient moving all extremities X 4, and Patient able to stick tongue midline  Post vital signs: Reviewed  Last Vitals:  Vitals Value Taken Time  BP 146/82 06/11/22 1218  Temp 36.6 C 06/11/22 1218  Pulse 67 06/11/22 1218  Resp 16 06/11/22 1218  SpO2 99 % 06/11/22 1218    Last Pain:  Vitals:   06/11/22 1217  TempSrc:   PainSc: 0-No pain         Complications: No notable events documented.

## 2022-06-11 NOTE — Anesthesia Procedure Notes (Signed)
Date/Time: 06/11/2022 11:54 AM  Performed by: Maude Leriche, CRNAPre-anesthesia Checklist: Patient identified, Emergency Drugs available, Suction available, Patient being monitored and Timeout performed Oxygen Delivery Method: Nasal cannula Placement Confirmation: positive ETCO2 Dental Injury: Teeth and Oropharynx as per pre-operative assessment

## 2022-06-15 NOTE — Anesthesia Postprocedure Evaluation (Signed)
Anesthesia Post Note  Patient: Larry Castillo  Procedure(s) Performed: CATARACT EXTRACTION PHACO AND INTRAOCULAR LENS PLACEMENT (IOC) (Left: Eye)  Patient location during evaluation: Phase II Anesthesia Type: MAC Level of consciousness: awake Pain management: pain level controlled Vital Signs Assessment: post-procedure vital signs reviewed and stable Respiratory status: spontaneous breathing and respiratory function stable Cardiovascular status: blood pressure returned to baseline and stable Postop Assessment: no headache and no apparent nausea or vomiting Anesthetic complications: no Comments: Late entry   No notable events documented.   Last Vitals:  Vitals:   06/11/22 1103 06/11/22 1218  BP: (!) 173/85 (!) 146/82  Pulse: 70 67  Resp: 18 16  Temp: 36.7 C 36.6 C  SpO2: 99% 99%    Last Pain:  Vitals:   06/11/22 1217  TempSrc:   PainSc: 0-No pain                 Louann Sjogren

## 2022-06-18 ENCOUNTER — Encounter (HOSPITAL_COMMUNITY): Payer: Self-pay | Admitting: Optometry

## 2022-06-18 NOTE — Addendum Note (Signed)
Addendum  created 06/18/22 1251 by Karna Dupes, CRNA   Intraprocedure Staff edited

## 2022-07-06 ENCOUNTER — Other Ambulatory Visit (HOSPITAL_COMMUNITY): Payer: Self-pay | Admitting: Nephrology

## 2022-07-06 DIAGNOSIS — R809 Proteinuria, unspecified: Secondary | ICD-10-CM | POA: Diagnosis not present

## 2022-07-06 DIAGNOSIS — I350 Nonrheumatic aortic (valve) stenosis: Secondary | ICD-10-CM | POA: Diagnosis not present

## 2022-07-06 DIAGNOSIS — N1831 Chronic kidney disease, stage 3a: Secondary | ICD-10-CM | POA: Diagnosis not present

## 2022-07-06 DIAGNOSIS — I129 Hypertensive chronic kidney disease with stage 1 through stage 4 chronic kidney disease, or unspecified chronic kidney disease: Secondary | ICD-10-CM

## 2022-07-06 DIAGNOSIS — I5032 Chronic diastolic (congestive) heart failure: Secondary | ICD-10-CM | POA: Diagnosis not present

## 2022-07-06 DIAGNOSIS — I2510 Atherosclerotic heart disease of native coronary artery without angina pectoris: Secondary | ICD-10-CM | POA: Diagnosis not present

## 2022-07-14 NOTE — Progress Notes (Signed)
[facility] Note  07/27/2022     CHIEF COMPLAINT Patient presents for Retina Evaluation   HISTORY OF PRESENT ILLNESS: Larry Castillo is a 82 y.o. male who presents to the clinic today for:   HPI     Retina Evaluation   In both eyes.  This started 1 week ago.  Duration of 1 week.  I, the attending physician,  performed the HPI with the patient and updated documentation appropriately.        Comments   Retina eval per Dr Graylon Good for ERM pt is reporting no vision changes noticed he denies any flashes or floaters pt has COAG and using timolol bid OD       Last edited by Bernarda Caffey, MD on 07/29/2022 12:12 AM.    Pt is here on the referral of Dr. Graylon Good for concern of ERM OS, pt had cataract OU with Dr. Graylon Good in November and February, pt states he saw [name] in 2014 and was told that he has a "crease" in his lens that he would need sx for, pt states he has glaucoma in his right eye only that is managed by Dr. Graylon Good, he is on timolol BID OU  Referring physician: Ronn Melena, MD [place],  [place] [id number]  HISTORICAL INFORMATION:   Selected notes from the MEDICAL RECORD NUMBER Referred by Dr. Graylon Good for concern of ERM OS LEE:  Ocular Hx- saw JDM in 2014 for ERM OS PMH-    CURRENT MEDICATIONS: No current outpatient medications on file. (Ophthalmic Drugs)   No current facility-administered medications for this visit. (Ophthalmic Drugs)   Current Outpatient Medications (Other)  Medication Sig   amLODipine (NORVASC) 10 MG tablet TAKE 1 TABLET BY MOUTH DAILY   aspirin 81 MG tablet Take 81 mg by mouth daily.     CALCIUM-VITAMIN D PO Take 1 tablet by mouth daily.   carvedilol (COREG) 3.125 MG tablet TAKE 1 TABLET BY MOUTH TWICE DAILY   Cyanocobalamin (VITAMIN B-12) 5000 MCG SUBL Place 1 tablet under the tongue daily.   ezetimibe (ZETIA) 10 MG tablet Take 1 tablet (10 mg total) by mouth daily.   fish oil-omega-3 fatty  acids 1000 MG capsule Take 1 capsule by mouth daily.     Multiple Vitamin (MULTIVITAMIN) tablet Take 1 tablet by mouth daily.     rosuvastatin (CRESTOR) 20 MG tablet Take 1 tablet (20 mg total) by mouth daily.   No current facility-administered medications for this visit. (Other)   REVIEW OF SYSTEMS: ROS   Positive for: Cardiovascular, Eyes Last edited by Parthenia Ames, COT on 07/27/2022  8:37 AM.     ALLERGIES No Known Allergies  PAST MEDICAL HISTORY Past Medical History:  Diagnosis Date   Anemia    Operative   Atrial fibrillation ([place])    post operative   Bleeding ulcer    CAD (coronary artery disease)    Cardiomyopathy    Hyperlipidemia    Hypertension    Rectal abscess    Recurrent sinus infections    Past Surgical History:  Procedure Laterality Date   CATARACT EXTRACTION W/PHACO Right 02/26/2022   Procedure: CATARACT EXTRACTION PHACO AND INTRAOCULAR LENS PLACEMENT ([place]);  Surgeon: Ronn Melena, MD;  Location: [facility];  Service: Ophthalmology;  Laterality: Right;  CDE 7.63   CATARACT EXTRACTION W/PHACO Left 06/11/2022   Procedure: CATARACT EXTRACTION PHACO AND INTRAOCULAR LENS PLACEMENT (IOC);  Surgeon: Ronn Melena, MD;  Location: [facility];  Service: Ophthalmology;  Laterality: Left;  CDE: 4.92   CORONARY ARTERY BYPASS GRAFT  05/30/2009   FAMILY HISTORY Family History  Problem Relation Age of Onset   Liver disease Mother        Failure   Heart attack Father    Suicidality Brother    Cancer Brother    SOCIAL HISTORY Social History   Tobacco Use   Smoking status: Never   Smokeless tobacco: Never  Vaping Use   Vaping Use: Never used  Substance Use Topics   Alcohol use: No   Drug use: Never       OPHTHALMIC EXAM:  Base Eye Exam     Visual Acuity (Snellen - Linear)       Right Left   Dist [place] 20/50 -2 20/200   Dist ph [place] 20/30 NI         Tonometry (Tonopen, 8:45 AM)       Right Left   Pressure 13 12         Pupils        Pupils Dark Light Shape React APD   Right PERRL 4 3 Round Sluggish None   Left PERRL 4 3 Round Sluggish None         Visual Fields       Left Right    Full Full         Extraocular Movement       Right Left    Full, Ortho Full, Ortho         Neuro/Psych     Oriented x3: Yes   Mood/Affect: Normal         Dilation     Both eyes: 2.5% Phenylephrine @ 8:45 AM           Slit Lamp and Fundus Exam     Slit Lamp Exam       Right Left   Lids/Lashes Dermatochalasis - upper lid, Ptosis, Telangiectasia, Meibomian gland dysfunction Dermatochalasis - upper lid, Ptosis, Telangiectasia, Meibomian gland dysfunction   Conjunctiva/Sclera White and quiet White and quiet   Cornea 1-2+ Punctate epithelial erosions, irregular epi inferiorly, well healed cataract wound 1-2+ Punctate epithelial erosions, mild tear film debris, well healed cataract wound   Anterior Chamber deep and clear deep and clear   Iris Round and dilated Round and dilated   Lens PC IOL in good position PC IOL in good position   Anterior Vitreous Vitreous syneresis, Asteroid hyalosis Vitreous syneresis, Asteroid hyalosis         Fundus Exam       Right Left   Disc +Pallor, +cupping 2+Pallor, Sharp rim   C/D Ratio 0.75 0.3   Macula Flat, Blunted foveal reflex, RPE mottling, No heme or edema Blunted foveal reflex, ERM with PRF and retinal thickening temporal macula   Vessels attenuated, Tortuous attenuated, Tortuous   Periphery Attached Attached           IMAGING AND PROCEDURES  Imaging and Procedures for 07/27/2022  OCT, Retina - OU - Both Eyes       Left Eye Quality was good. Central Foveal Thickness: 526. Progression has worsened. Findings include no IRF, no SRF, abnormal foveal contour, epiretinal membrane, macular pucker, preretinal fibrosis (ERM with progression of PRF and retinal thickening temporal macula compared to 2014 study).   Notes *Images captured and stored on drive  Diagnosis  / Impression:  OD: no images obtained -- pt refused scans OS: ERM with progression of PRF and retinal thickening  temporal macula compared to 2014 study  Clinical management:  See below  Abbreviations: NFP - Normal foveal profile. CME - cystoid macular edema. PED - pigment epithelial detachment. IRF - intraretinal fluid. SRF - subretinal fluid. EZ - ellipsoid zone. ERM - epiretinal membrane. ORA - outer retinal atrophy. ORT - outer retinal tubulation. SRHM - subretinal hyper-reflective material. IRHM - intraretinal hyper-reflective material            ASSESSMENT/PLAN:    ICD-10-CM   1. Epiretinal membrane (ERM) of left eye  H35.372 OCT, Retina - OU - Both Eyes    2. Essential hypertension  I10     3. Hypertensive retinopathy of both eyes  H35.033     4. Pseudophakia, both eyes  Z96.1       Epiretinal membrane, left eye  - pt was seen by JDM in 2014 for ERM OS and we have OCTs from that visit in our system - The natural history, anatomy, potential for loss of vision, and treatment options including vitrectomy techniques and the complications of endophthalmitis, retinal detachment, vitreous hemorrhage, cataract progression and permanent vision loss discussed with the patient. - progression of ERM from 2014 -- increased thickness and PRF temporal macula - BCVA 20/200 from 20/100 in 2014 - Pt reports +metamorphopsia but concerned about recovery - discussed small possibility that removal of ERM may not result in a significant improvement in visual acuity and that final result may take up to a year or more to stabilize - pt wishes to monitor for now - f/u 3 mos -- DFE/OCT  2,3. Hypertensive retinopathy OU - discussed importance of tight BP control - monitor  4. Pseudophakia OU  - s/p CE/IOL (Dr. Graylon Good)  - IOLs in good position, doing well  - monitor  Ophthalmic Meds Ordered this visit:  No orders of the defined types were placed in this encounter.    Return in about 3  months (around 10/27/2022) for f/u ERM OS, DFE, OCT.  There are no Patient Instructions on file for this visit.   Explained the diagnoses, plan, and follow up with the patient and they expressed understanding.  Patient expressed understanding of the importance of proper follow up care.    This document serves as a record of services personally performed by Gardiner Sleeper, MD, PhD. It was created on their behalf by Renaldo Reel, [place] an ophthalmic technician. The creation of this record is the provider's dictation and/or activities during the visit.    Electronically signed by:  Renaldo Reel, COT  03.13.24  12:13 AM  This document serves as a record of services personally performed by Gardiner Sleeper, MD, PhD. It was created on their behalf by San Jetty. Owens Shark, OA an ophthalmic technician. The creation of this record is the provider's dictation and/or activities during the visit.    Electronically signed by: San Jetty. Owens Shark, [place] 03.26.2024 12:13 AM  Gardiner Sleeper, M.D., Ph.D. Diseases & Surgery of the Retina and Vitreous [facility]  I have reviewed the above documentation for accuracy and completeness, and I agree with the above. Gardiner Sleeper, M.D., Ph.D. 07/29/22 12:18 AM   Abbreviations: M myopia (nearsighted); A astigmatism; H hyperopia (farsighted); P presbyopia; Mrx spectacle prescription;  CTL contact lenses; OD right eye; OS left eye; OU both eyes  XT exotropia; ET esotropia; PEK punctate epithelial keratitis; PEE punctate epithelial erosions; DES dry eye syndrome; MGD meibomian gland dysfunction; ATs artificial tears; PFAT's preservative free artificial tears;  [place] nuclear sclerotic cataract; PSC posterior subcapsular cataract; ERM epi-retinal membrane; PVD posterior vitreous detachment; RD retinal detachment; DM diabetes mellitus; DR diabetic retinopathy; NPDR non-proliferative diabetic retinopathy; PDR proliferative diabetic retinopathy; CSME  clinically significant macular edema; DME diabetic macular edema; dbh dot blot hemorrhages; CWS cotton wool spot; POAG primary open angle glaucoma; C/D cup-to-disc ratio; HVF humphrey visual field; GVF goldmann visual field; OCT optical coherence tomography; IOP intraocular pressure; BRVO Branch retinal vein occlusion; CRVO central retinal vein occlusion; CRAO central retinal artery occlusion; BRAO branch retinal artery occlusion; RT retinal tear; SB scleral buckle; PPV pars plana vitrectomy; VH Vitreous hemorrhage; PRP panretinal laser photocoagulation; IVK intravitreal kenalog; VMT vitreomacular traction; MH Macular hole;  NVD neovascularization of the disc; NVE neovascularization elsewhere; AREDS age related eye disease study; ARMD age related macular degeneration; POAG primary open angle glaucoma; EBMD epithelial/anterior basement membrane dystrophy; ACIOL anterior chamber intraocular lens; IOL intraocular lens; PCIOL posterior chamber intraocular lens; Phaco/IOL phacoemulsification with intraocular lens placement; [place] photorefractive keratectomy; LASIK laser assisted in situ keratomileusis; HTN hypertension; DM diabetes mellitus; COPD chronic obstructive pulmonary disease

## 2022-07-20 DIAGNOSIS — L820 Inflamed seborrheic keratosis: Secondary | ICD-10-CM | POA: Diagnosis not present

## 2022-07-20 DIAGNOSIS — D3617 Benign neoplasm of peripheral nerves and autonomic nervous system of trunk, unspecified: Secondary | ICD-10-CM | POA: Diagnosis not present

## 2022-07-20 DIAGNOSIS — D239 Other benign neoplasm of skin, unspecified: Secondary | ICD-10-CM | POA: Diagnosis not present

## 2022-07-20 DIAGNOSIS — D485 Neoplasm of uncertain behavior of skin: Secondary | ICD-10-CM | POA: Diagnosis not present

## 2022-07-27 ENCOUNTER — Ambulatory Visit (INDEPENDENT_AMBULATORY_CARE_PROVIDER_SITE_OTHER): Payer: 59 | Admitting: Ophthalmology

## 2022-07-27 DIAGNOSIS — Z961 Presence of intraocular lens: Secondary | ICD-10-CM | POA: Diagnosis not present

## 2022-07-27 DIAGNOSIS — H35372 Puckering of macula, left eye: Secondary | ICD-10-CM | POA: Diagnosis not present

## 2022-07-27 DIAGNOSIS — H35033 Hypertensive retinopathy, bilateral: Secondary | ICD-10-CM

## 2022-07-27 DIAGNOSIS — I10 Essential (primary) hypertension: Secondary | ICD-10-CM | POA: Diagnosis not present

## 2022-07-29 ENCOUNTER — Encounter (INDEPENDENT_AMBULATORY_CARE_PROVIDER_SITE_OTHER): Payer: Self-pay | Admitting: Ophthalmology

## 2022-11-16 DIAGNOSIS — H35372 Puckering of macula, left eye: Secondary | ICD-10-CM | POA: Diagnosis not present

## 2022-11-16 DIAGNOSIS — H4321 Crystalline deposits in vitreous body, right eye: Secondary | ICD-10-CM | POA: Diagnosis not present

## 2022-11-16 DIAGNOSIS — H401113 Primary open-angle glaucoma, right eye, severe stage: Secondary | ICD-10-CM | POA: Diagnosis not present

## 2022-11-16 DIAGNOSIS — H04123 Dry eye syndrome of bilateral lacrimal glands: Secondary | ICD-10-CM | POA: Diagnosis not present

## 2022-12-07 ENCOUNTER — Telehealth: Payer: Self-pay | Admitting: Internal Medicine

## 2022-12-07 NOTE — Telephone Encounter (Signed)
Pt called in asking to switch from Dr. Jenene Slicker to Dr. Wyline Mood, is this switch okay with you all?

## 2022-12-13 NOTE — Telephone Encounter (Signed)
Called pt to sch provider switch or with APP, "Call cannot be completed" - CRM

## 2022-12-21 DIAGNOSIS — N189 Chronic kidney disease, unspecified: Secondary | ICD-10-CM | POA: Diagnosis not present

## 2022-12-21 DIAGNOSIS — I10 Essential (primary) hypertension: Secondary | ICD-10-CM | POA: Diagnosis not present

## 2022-12-21 DIAGNOSIS — R591 Generalized enlarged lymph nodes: Secondary | ICD-10-CM | POA: Diagnosis not present

## 2022-12-21 DIAGNOSIS — H409 Unspecified glaucoma: Secondary | ICD-10-CM | POA: Diagnosis not present

## 2022-12-21 DIAGNOSIS — I2510 Atherosclerotic heart disease of native coronary artery without angina pectoris: Secondary | ICD-10-CM | POA: Diagnosis not present

## 2022-12-28 DIAGNOSIS — R590 Localized enlarged lymph nodes: Secondary | ICD-10-CM | POA: Diagnosis not present

## 2022-12-28 DIAGNOSIS — R599 Enlarged lymph nodes, unspecified: Secondary | ICD-10-CM | POA: Diagnosis not present

## 2023-01-18 DIAGNOSIS — I10 Essential (primary) hypertension: Secondary | ICD-10-CM | POA: Diagnosis not present

## 2023-01-18 DIAGNOSIS — R7303 Prediabetes: Secondary | ICD-10-CM | POA: Diagnosis not present

## 2023-01-18 DIAGNOSIS — N189 Chronic kidney disease, unspecified: Secondary | ICD-10-CM | POA: Diagnosis not present

## 2023-01-18 DIAGNOSIS — I2510 Atherosclerotic heart disease of native coronary artery without angina pectoris: Secondary | ICD-10-CM | POA: Diagnosis not present

## 2023-01-18 DIAGNOSIS — Z0001 Encounter for general adult medical examination with abnormal findings: Secondary | ICD-10-CM | POA: Diagnosis not present

## 2023-01-18 DIAGNOSIS — E559 Vitamin D deficiency, unspecified: Secondary | ICD-10-CM | POA: Diagnosis not present

## 2023-01-18 DIAGNOSIS — R591 Generalized enlarged lymph nodes: Secondary | ICD-10-CM | POA: Diagnosis not present

## 2023-01-18 DIAGNOSIS — E039 Hypothyroidism, unspecified: Secondary | ICD-10-CM | POA: Diagnosis not present

## 2023-01-18 DIAGNOSIS — R739 Hyperglycemia, unspecified: Secondary | ICD-10-CM | POA: Diagnosis not present

## 2023-01-25 DIAGNOSIS — I2510 Atherosclerotic heart disease of native coronary artery without angina pectoris: Secondary | ICD-10-CM | POA: Diagnosis not present

## 2023-01-25 DIAGNOSIS — Z23 Encounter for immunization: Secondary | ICD-10-CM | POA: Diagnosis not present

## 2023-01-25 DIAGNOSIS — I10 Essential (primary) hypertension: Secondary | ICD-10-CM | POA: Diagnosis not present

## 2023-01-25 DIAGNOSIS — N189 Chronic kidney disease, unspecified: Secondary | ICD-10-CM | POA: Diagnosis not present

## 2023-01-25 DIAGNOSIS — H409 Unspecified glaucoma: Secondary | ICD-10-CM | POA: Diagnosis not present

## 2023-01-25 DIAGNOSIS — I350 Nonrheumatic aortic (valve) stenosis: Secondary | ICD-10-CM | POA: Diagnosis not present

## 2023-01-25 DIAGNOSIS — Z0001 Encounter for general adult medical examination with abnormal findings: Secondary | ICD-10-CM | POA: Diagnosis not present

## 2023-02-01 DIAGNOSIS — Z23 Encounter for immunization: Secondary | ICD-10-CM | POA: Diagnosis not present

## 2023-03-01 DIAGNOSIS — R591 Generalized enlarged lymph nodes: Secondary | ICD-10-CM | POA: Diagnosis not present

## 2023-03-01 DIAGNOSIS — I2510 Atherosclerotic heart disease of native coronary artery without angina pectoris: Secondary | ICD-10-CM | POA: Diagnosis not present

## 2023-03-01 DIAGNOSIS — N189 Chronic kidney disease, unspecified: Secondary | ICD-10-CM | POA: Diagnosis not present

## 2023-05-06 ENCOUNTER — Other Ambulatory Visit: Payer: Self-pay | Admitting: Internal Medicine

## 2023-06-07 DIAGNOSIS — H401113 Primary open-angle glaucoma, right eye, severe stage: Secondary | ICD-10-CM | POA: Diagnosis not present

## 2023-06-07 DIAGNOSIS — H35372 Puckering of macula, left eye: Secondary | ICD-10-CM | POA: Diagnosis not present

## 2023-06-07 DIAGNOSIS — Z961 Presence of intraocular lens: Secondary | ICD-10-CM | POA: Diagnosis not present

## 2023-06-07 DIAGNOSIS — H4321 Crystalline deposits in vitreous body, right eye: Secondary | ICD-10-CM | POA: Diagnosis not present

## 2023-06-07 DIAGNOSIS — H04123 Dry eye syndrome of bilateral lacrimal glands: Secondary | ICD-10-CM | POA: Diagnosis not present

## 2023-07-13 ENCOUNTER — Encounter: Payer: Self-pay | Admitting: Internal Medicine

## 2023-07-13 ENCOUNTER — Ambulatory Visit: Attending: Internal Medicine | Admitting: Internal Medicine

## 2023-07-13 VITALS — BP 118/62 | HR 68 | Ht 72.0 in | Wt 219.0 lb

## 2023-07-13 DIAGNOSIS — I351 Nonrheumatic aortic (valve) insufficiency: Secondary | ICD-10-CM

## 2023-07-13 DIAGNOSIS — I10 Essential (primary) hypertension: Secondary | ICD-10-CM

## 2023-07-13 DIAGNOSIS — I350 Nonrheumatic aortic (valve) stenosis: Secondary | ICD-10-CM | POA: Diagnosis not present

## 2023-07-13 NOTE — Progress Notes (Signed)
 Cardiology Office Note  Date: 07/13/2023   ID: Larry Castillo, DOB 03/14/1941, MRN [id number]  PCP:  Patient, No Pcp Per  Cardiologist:  Marjo Bicker, MD Electrophysiologist:  None   Reason for Office Visit: Follow-up of CAD and moderate AS   History of Present Illness: Larry Castillo is a 83 y.o. male known to have CAD s/p CABG in 2011, HLD, HTN, postop A-fib not on AC, moderate aortic stenosis, CKD presented to cardiology clinic for follow-up visit.  Patient underwent echocardiogram in 2023 and 2024 which showed moderate aortic valve stenosis, mild aortic regurgitation and normal LVEF.  No interval symptoms.  No interval angina, DOE, dizziness, presyncope, syncope, palpitations or leg swelling.  He continues to work.  Denies smoking cigarettes.  Past Medical History:  Diagnosis Date   Anemia    Operative   Atrial fibrillation (HCC)    post operative   Bleeding ulcer    CAD (coronary artery disease)    Cardiomyopathy    Hyperlipidemia    Hypertension    Rectal abscess    Recurrent sinus infections     Past Surgical History:  Procedure Laterality Date   CATARACT EXTRACTION W/PHACO Right 02/26/2022   Procedure: CATARACT EXTRACTION PHACO AND INTRAOCULAR LENS PLACEMENT (IOC);  Surgeon: Pecolia Ades, MD;  Location: [facility];  Service: Ophthalmology;  Laterality: Right;  CDE 7.63   CATARACT EXTRACTION W/PHACO Left 06/11/2022   Procedure: CATARACT EXTRACTION PHACO AND INTRAOCULAR LENS PLACEMENT (IOC);  Surgeon: Pecolia Ades, MD;  Location: [facility];  Service: Ophthalmology;  Laterality: Left;  CDE: 4.92   CORONARY ARTERY BYPASS GRAFT  05/30/2009    Current Outpatient Medications  Medication Sig Dispense Refill   amLODipine (NORVASC) 10 MG tablet TAKE 1 TABLET BY MOUTH DAILY 30 tablet 0   aspirin 81 MG tablet Take 81 mg by mouth daily.       carvedilol (COREG) 3.125 MG tablet TAKE 1 TABLET BY MOUTH TWICE DAILY 60 tablet 0   Cyanocobalamin (VITAMIN B-12) 5000 MCG  SUBL Place 1 tablet under the tongue daily.     ezetimibe (ZETIA) 10 MG tablet TAKE 1 TABLET BY MOUTH EVERY DAY 90 tablet 0   fish oil-omega-3 fatty acids 1000 MG capsule Take 1 capsule by mouth daily.       Multiple Vitamin (MULTIVITAMIN) tablet Take 1 tablet by mouth daily.       rosuvastatin (CRESTOR) 20 MG tablet TAKE 1 TABLET BY MOUTH DAILY 30 tablet 0   timolol (TIMOPTIC) 0.5 % ophthalmic solution Place 1 drop into the right eye 2 (two) times daily.     CALCIUM-VITAMIN D PO Take 1 tablet by mouth daily. (Patient not taking: Reported on 07/13/2023)     No current facility-administered medications for this visit.   Allergies:  Patient has no known allergies.   Social History: The patient  reports that he has never smoked. He has never used smokeless tobacco. He reports that he does not drink alcohol and does not use drugs.   Family History: The patient's family history includes Cancer in his brother; Heart attack in his father; Liver disease in his mother; Suicidality in his brother.   ROS:  Please see the history of present illness. Otherwise, complete review of systems is positive for none.  All other systems are reviewed and negative.   Physical Exam: VS:  BP 118/62 (Cuff Size: Normal)   Pulse 68   Ht 6' (1.829 m)   Wt 219 lb (99.3 kg)  SpO2 97%   BMI 29.70 kg/m , BMI Body mass index is 29.7 kg/m.  Wt Readings from Last 3 Encounters:  07/13/23 219 lb (99.3 kg)  06/11/22 228 lb 2.8 oz (103.5 kg)  06/09/22 228 lb 2.8 oz (103.5 kg)    General: Patient appears comfortable at rest. HEENT: Conjunctiva and lids normal, oropharynx clear with moist mucosa. Neck: Supple, no elevated JVP or carotid bruits, no thyromegaly. Lungs: Clear to auscultation, nonlabored breathing at rest. Cardiac: Regular rate and rhythm, ejection systolic murmur with presence of S2 Abdomen: Soft, nontender, no hepatomegaly, bowel sounds present, no guarding or rebound. Extremities: No pitting edema,  distal pulses 2+. Skin: Warm and dry. Musculoskeletal: No kyphosis. Neuropsychiatric: Alert and oriented x3, affect grossly appropriate.  ECG: Normal sinus rhythm with no ST changes  Recent Labwork: No results found for requested labs within last 365 days.     Component Value Date/Time   CHOL 153 05/05/2021 0816   TRIG 58 05/05/2021 0816   HDL 64 05/05/2021 0816   CHOLHDL 2.4 05/05/2021 0816   VLDL 12 05/05/2021 0816   LDLCALC 77 05/05/2021 0816    Assessment and Plan:  # CAD s/p CABG in 2011, currently angina free -Continue aspirin 81 mg once daily -Continue rosuvastatin 20 mg nightly and Zetia 10 mg once daily. -ER precautions for chest pain  # Moderate aortic valve stenosis  # Mild aortic valve regurgitation -Physical exam remarkable for ejection systolic murmur with S2 consistent with moderate aortic valve stenosis.  Obtain echocardiogram now and in 1 year before the next clinic visit.  No interval symptoms of aortic stenosis.  I discussed with him the symptoms and management of severe aortic valve stenosis.  # HLD, unknown numbers -Continue rosuvastatin 20 mg nightly and Zetia 10 mg once daily.  He is getting blood work with his PCP next week Tuesday.  Will obtain the reports.  # HTN, controlled -Continue carvedilol 3.125 mg twice daily and amlodipine 10 mg once daily  # CKD stage IIIa vs IIIb -Follows up with PCP.    Medication Adjustments/Labs and Tests Ordered: Current medicines are reviewed at length with the patient today.  Concerns regarding medicines are outlined above.   Tests Ordered: Orders Placed This Encounter  Procedures   EKG 12-Lead    Medication Changes: No orders of the defined types were placed in this encounter.   Disposition:  Follow up  1 year  Signed Armine Rizzolo Verne Spurr, MD, 07/13/2023 8:49 AM    [facility] at [facility] [address] [place], [place], [place] [address]

## 2023-07-13 NOTE — Patient Instructions (Signed)
 Medication Instructions:  Your physician recommends that you continue on your current medications as directed. Please refer to the Current Medication list given to you today.  *If you need a refill on your cardiac medications before your next appointment, please call your pharmacy*   Lab Work: None If you have labs (blood work) drawn today and your tests are completely normal, you will receive your results only by: MyChart Message (if you have MyChart) OR A paper copy in the mail If you have any lab test that is abnormal or we need to change your treatment, we will call you to review the results.   Testing/Procedures: Your physician has requested that you have an echocardiogram. Echocardiography is a painless test that uses sound waves to create images of your heart. It provides your doctor with information about the size and shape of your heart and how well your heart's chambers and valves are working. This procedure takes approximately one hour. There are no restrictions for this procedure. Please do NOT wear cologne, perfume, aftershave, or lotions (deodorant is allowed). Please arrive 15 minutes prior to your appointment time.  Please note: We ask at that you not bring children with you during ultrasound (echo/ vascular) testing. Due to room size and safety concerns, children are not allowed in the ultrasound rooms during exams. Our front office staff cannot provide observation of children in our lobby area while testing is being conducted. An adult accompanying a patient to their appointment will only be allowed in the ultrasound room at the discretion of the ultrasound technician under special circumstances. We apologize for any inconvenience.    Follow-Up: At [facility], you and your health needs are our priority.  As part of our continuing mission to provide you with exceptional heart care, we have created designated Provider Care Teams.  These Care Teams include your  primary Cardiologist (physician) and Advanced Practice Providers (APPs -  Physician Assistants and Nurse Practitioners) who all work together to provide you with the care you need, when you need it.  We recommend signing up for the patient portal called "MyChart".  Sign up information is provided on this After Visit Summary.  MyChart is used to connect with patients for Virtual Visits (Telemedicine).  Patients are able to view lab/test results, encounter notes, upcoming appointments, etc.  Non-urgent messages can be sent to your provider as well.   To learn more about what you can do with MyChart, go to [email or web address].    Your next appointment:   1 year(s)  Provider:   You may see Vishnu P Mallipeddi, MD or one of the following Advanced Practice Providers on your designated Care Team:   [place], PA-C  Jacolyn Reedy, [place]     Other Instructions

## 2023-07-19 DIAGNOSIS — N189 Chronic kidney disease, unspecified: Secondary | ICD-10-CM | POA: Diagnosis not present

## 2023-07-19 DIAGNOSIS — Z131 Encounter for screening for diabetes mellitus: Secondary | ICD-10-CM | POA: Diagnosis not present

## 2023-07-19 DIAGNOSIS — I10 Essential (primary) hypertension: Secondary | ICD-10-CM | POA: Diagnosis not present

## 2023-07-26 DIAGNOSIS — Z1389 Encounter for screening for other disorder: Secondary | ICD-10-CM | POA: Diagnosis not present

## 2023-07-26 DIAGNOSIS — Z0000 Encounter for general adult medical examination without abnormal findings: Secondary | ICD-10-CM | POA: Diagnosis not present

## 2023-07-26 DIAGNOSIS — E7800 Pure hypercholesterolemia, unspecified: Secondary | ICD-10-CM | POA: Diagnosis not present

## 2023-07-26 DIAGNOSIS — I2510 Atherosclerotic heart disease of native coronary artery without angina pectoris: Secondary | ICD-10-CM | POA: Diagnosis not present

## 2023-07-26 DIAGNOSIS — Z0001 Encounter for general adult medical examination with abnormal findings: Secondary | ICD-10-CM | POA: Diagnosis not present

## 2023-07-26 DIAGNOSIS — N189 Chronic kidney disease, unspecified: Secondary | ICD-10-CM | POA: Diagnosis not present

## 2023-07-26 DIAGNOSIS — H409 Unspecified glaucoma: Secondary | ICD-10-CM | POA: Diagnosis not present

## 2023-08-02 ENCOUNTER — Ambulatory Visit (HOSPITAL_COMMUNITY)
Admission: RE | Admit: 2023-08-02 | Discharge: 2023-08-02 | Disposition: A | Discharge status: Home / Self Care | Source: Ambulatory Visit | Attending: Internal Medicine | Admitting: Internal Medicine

## 2023-08-02 DIAGNOSIS — I350 Nonrheumatic aortic (valve) stenosis: Secondary | ICD-10-CM | POA: Diagnosis not present

## 2023-08-02 LAB — ECHOCARDIOGRAM COMPLETE
AR max vel: 1.1 cm2
AV Area VTI: 1.01 cm2
AV Area mean vel: 1.02 cm2
AV Mean grad: 24 mmHg
AV Peak grad: 40 mmHg
Ao pk vel: 3.16 m/s
Area-P 1/2: 4.07 cm2
Est EF: 40
MV M vel: 5.77 m/s
MV Peak grad: 133.2 mmHg
P 1/2 time: 353 ms
S' Lateral: 4 cm

## 2023-08-02 NOTE — Progress Notes (Signed)
*  PRELIMINARY RESULTS* Echocardiogram 2D Echocardiogram has been performed.  Stacey Drain 08/02/2023, 11:27 AM

## 2023-08-23 DIAGNOSIS — I2510 Atherosclerotic heart disease of native coronary artery without angina pectoris: Secondary | ICD-10-CM | POA: Diagnosis not present

## 2023-08-23 DIAGNOSIS — N189 Chronic kidney disease, unspecified: Secondary | ICD-10-CM | POA: Diagnosis not present

## 2023-08-23 DIAGNOSIS — H409 Unspecified glaucoma: Secondary | ICD-10-CM | POA: Diagnosis not present

## 2023-08-23 DIAGNOSIS — E7800 Pure hypercholesterolemia, unspecified: Secondary | ICD-10-CM | POA: Diagnosis not present

## 2023-09-06 DIAGNOSIS — H401113 Primary open-angle glaucoma, right eye, severe stage: Secondary | ICD-10-CM | POA: Diagnosis not present

## 2023-09-06 DIAGNOSIS — H04123 Dry eye syndrome of bilateral lacrimal glands: Secondary | ICD-10-CM | POA: Diagnosis not present

## 2023-09-06 DIAGNOSIS — H35372 Puckering of macula, left eye: Secondary | ICD-10-CM | POA: Diagnosis not present

## 2023-09-06 DIAGNOSIS — H4321 Crystalline deposits in vitreous body, right eye: Secondary | ICD-10-CM | POA: Diagnosis not present

## 2023-09-06 DIAGNOSIS — Z961 Presence of intraocular lens: Secondary | ICD-10-CM | POA: Diagnosis not present

## 2023-09-20 ENCOUNTER — Encounter: Payer: Self-pay | Admitting: Medical

## 2023-09-20 ENCOUNTER — Ambulatory Visit: Attending: Medical | Admitting: Medical

## 2023-09-20 VITALS — BP 136/64 | HR 70 | Ht 72.0 in | Wt 229.0 lb

## 2023-09-20 DIAGNOSIS — I5022 Chronic systolic (congestive) heart failure: Secondary | ICD-10-CM

## 2023-09-20 DIAGNOSIS — I350 Nonrheumatic aortic (valve) stenosis: Secondary | ICD-10-CM

## 2023-09-20 DIAGNOSIS — I429 Cardiomyopathy, unspecified: Secondary | ICD-10-CM

## 2023-09-20 DIAGNOSIS — I493 Ventricular premature depolarization: Secondary | ICD-10-CM

## 2023-09-20 DIAGNOSIS — Z79899 Other long term (current) drug therapy: Secondary | ICD-10-CM

## 2023-09-20 DIAGNOSIS — I2510 Atherosclerotic heart disease of native coronary artery without angina pectoris: Secondary | ICD-10-CM

## 2023-09-20 DIAGNOSIS — N1831 Chronic kidney disease, stage 3a: Secondary | ICD-10-CM

## 2023-09-20 MED ORDER — EMPAGLIFLOZIN 10 MG PO TABS: 10.0000 mg | ORAL_TABLET | Freq: Every day | ORAL | 11 refills | Status: DC

## 2023-09-20 NOTE — Patient Instructions (Signed)
 Medication Instructions:  Your physician has recommended you make the following change in your medication:   -Start Jardiance 10 mg once daily   *If you need a refill on your cardiac medications before your next appointment, please call your pharmacy*  Lab Work: In 2 weeks: -BMET  If you have labs (blood work) drawn today and your tests are completely normal, you will receive your results only by: MyChart Message (if you have MyChart) OR A paper copy in the mail If you have any lab test that is abnormal or we need to change your treatment, we will call you to review the results.  Testing/Procedures: Your physician has requested that you have a lexiscan myoview. For further information please visit [email or web address] Please follow instruction sheet, as given.   Follow-Up: At [facility], you and your health needs are our priority.  As part of our continuing mission to provide you with exceptional heart care, our providers are all part of one team.  This team includes your primary Cardiologist (physician) and Advanced Practice Providers or APPs (Physician Assistants and Nurse Practitioners) who all work together to provide you with the care you need, when you need it.  Your next appointment:   1 month(s)  Provider:   You may see Vishnu P Mallipeddi, MD or one of the following Advanced Practice Providers on your designated Care Team:   [place], PA-C  Scotesia Avilla, [place] Theotis Flake, [place]     We recommend signing up for the patient portal called "MyChart".  Sign up information is provided on this After Visit Summary.  MyChart is used to connect with patients for Virtual Visits (Telemedicine).  Patients are able to view lab/test results, encounter notes, upcoming appointments, etc.  Non-urgent messages can be sent to your provider as well.   To learn more about what you can do with MyChart, go to [email or web address].   Other Instructions

## 2023-09-20 NOTE — Progress Notes (Signed)
 Cardiology Office Note:  .   Date:  09/20/2023  ID:  Larry Castillo, DOB January 16, 1941, MRN [id number] PCP: Patient, No Pcp Per  [facility] Providers Cardiologist:  Lasalle Pointer, MD {  History of Present Illness: .   Larry Castillo is a 83 y.o. male with h/o CAD s/p CABG in 2011, HLD, HTN, post-op Afib no on AC, moderate AS, CKD stage 3 who presents for follow-up.   Echo in 2023 and 2024 showed moderate AS, mild AI and normal LVEF.   She was last seen 07/2023 and was stable from a cardiac perspective. Echo was ordered to assess aortic valve. Echo showed reduced LVEF 45%, elevated LA pressure, normal RV function, moderate AS.   Today, the patient is overall doing well. The patient reports heart burn over the last 2 weeks. He does not take anything for GERD. He denies chest pain or SOB. No lower leg edema.    Studies Reviewed: .        Echo 08/2023 1. Left ventricular ejection fraction, by estimation, is 40%. The left  ventricle has moderately decreased function. The left ventricle  demonstrates global hypokinesis. Left ventricular diastolic parameters are  indeterminate. Elevated left atrial  pressure.   2. Right ventricular systolic function is normal. The right ventricular  size is normal.   3. Left atrial size was severely dilated.   4. The mitral valve is normal in structure. Trivial mitral valve  regurgitation. No evidence of mitral stenosis.   5. The aortic valve has an indeterminant number of cusps. There is mild  calcification of the aortic valve. There is mild thickening of the aortic  valve. Aortic valve regurgitation is trivial. Moderate aortic valve  stenosis. Aortic valve area, by VTI  measures 1.01 cm. Aortic valve mean gradient measures 24.0 mmHg.   Echo 1/20204  1. Left ventricular ejection fraction, by estimation, is 55 to 60%. The  left ventricle has normal function. The left ventricle has no regional  Finkler motion abnormalities. Left ventricular  diastolic parameters are  consistent with Grade I diastolic  dysfunction (impaired relaxation). Elevated left atrial pressure. The  average left ventricular global longitudinal strain is -18.6 %. The global  longitudinal strain is normal.   2. Right ventricular systolic function is normal. The right ventricular  size is normal. There is normal pulmonary artery systolic pressure.   3. Left atrial size was severely dilated.   4. The mitral valve is normal in structure. Mild mitral valve  regurgitation. No evidence of mitral stenosis.   5. The tricuspid valve is abnormal.   6. The aortic valve has an indeterminant number of cusps. There is severe  calcifcation of the aortic valve. There is severe thickening of the aortic  valve. Aortic valve regurgitation is mild. Moderate aortic valve stenosis.  Aortic valve mean gradient  measures 23.5 mmHg. Aortic valve peak gradient measures 41.4 mmHg. Aortic  valve area, by VTI measures 1.24 cm.   7. The inferior vena cava is normal in size with greater than 50%  respiratory variability, suggesting right atrial pressure of 3 mmHg.   Comparison(s): Echocardiogram done 06/02/20 showed an EF of 55-60% with  moderate AS and an AV Mean Grad of 25.3 mmHg.   Echo 05/2021 1. Left ventricular ejection fraction, by estimation, is 55 to 60%. The  left ventricle has normal function. The left ventricle has no regional  Ullmer motion abnormalities. Left ventricular diastolic parameters are  consistent with Grade I diastolic  dysfunction (impaired relaxation).   2. Right ventricular systolic function is normal. The right ventricular  size is normal. Tricuspid regurgitation signal is inadequate for assessing  PA pressure.   3. Left atrial size was mildly dilated.   4. The mitral valve is abnormal. Mild mitral valve regurgitation. No  evidence of mitral stenosis.   5. The aortic valve is tricuspid. There is mild calcification of the  aortic valve. There is mild  thickening of the aortic valve. Aortic valve  regurgitation is mild. Moderate aortic valve stenosis. Aortic valve mean  gradient measures 25.3 mmHg. Aortic  valve peak gradient measures 40.8 mmHg. Aortic valve area, by VTI measures  1.20 cm.   6. The inferior vena cava is normal in size with greater than 50%  respiratory variability, suggesting right atrial pressure of 3 mmHg.      Physical Exam:   VS:  BP 136/64 (BP Location: Left Arm, Cuff Size: Normal)   Pulse 70   Ht 6' (1.829 m)   Wt 229 lb (103.9 kg)   SpO2 96%   BMI 31.06 kg/m    Wt Readings from Last 3 Encounters:  09/20/23 229 lb (103.9 kg)  07/13/23 219 lb (99.3 kg)  06/11/22 228 lb 2.8 oz (103.5 kg)    GEN: Well nourished, well developed in no acute distress NECK: No JVD; No carotid bruits CARDIAC: RRR,+ murmurs,  norubs, gallops RESPIRATORY:  Clear to auscultation without rales, wheezing or rhonchi  ABDOMEN: Soft, non-tender, non-distended EXTREMITIES:  No edema; No deformity   ASSESSMENT AND PLAN: .    HFmrEF Echo 05/2022 showed LVEF 55-60%, G1DD. Echo 08/2023 showed LVEF 40%, global HK, elevated left atrial pressure, normal RVSF, trivial MR, trivial AI,moderate AS. The patient is euvolemic on exam. He denies chest pain or SOB. Unclear etiology of cardiomyopathy. He has CKD, will order a Myoview lexiscan. He is not wanting extensive cardiac work-up. I will add on Farxiga 10mg  daily, BMET in 2 weeks.   Aortic stenosis Echo showed moderate AS VTI 1.01cm2, mean gradient , which is stable from prior.  PVCs EKG shows NSR with PVCs. Patient does not want to wear a heart monitor, will revisit at follow-up.   CAD s/p CABG in 2011 Patient denies anginal symptoms. Due to reduced EF, plan for MPI as above. Continue ASA 81mg  daily, Crestor  20mg  daily and Zetia  10mg  daily.   HLD LDL 81, goal<70. Continue Zetia  and Crestor .   HTN BP is reasonable. Continue Coreg  3.125mg  BID and Amlodipine  10mg  daily.   CKD stage  3 Baseline Scr around 1.5.      Informed Consent   Shared Decision Making/Informed Consent The risks [chest pain, shortness of breath, cardiac arrhythmias, dizziness, blood pressure fluctuations, myocardial infarction, stroke/transient ischemic attack, nausea, vomiting, allergic reaction, radiation exposure, metallic taste sensation and life-threatening complications (estimated to be 1 in 10,000)], benefits (risk stratification, diagnosing coronary artery disease, treatment guidance) and alternatives of a nuclear stress test were discussed in detail with Mr. Toner and he agrees to proceed.     Dispo: Follow-up in 1 month  Signed, Ansleigh Safer Rebekah [place], PA-C

## 2023-10-04 ENCOUNTER — Other Ambulatory Visit (HOSPITAL_COMMUNITY)
Admission: RE | Admit: 2023-10-04 | Discharge: 2023-10-04 | Disposition: A | Discharge status: Home / Self Care | Source: Ambulatory Visit | Attending: Medical | Admitting: Medical

## 2023-10-04 ENCOUNTER — Ambulatory Visit (HOSPITAL_COMMUNITY)
Admission: RE | Admit: 2023-10-04 | Discharge: 2023-10-04 | Disposition: A | Discharge status: Home / Self Care | Source: Ambulatory Visit | Attending: Internal Medicine | Admitting: Internal Medicine

## 2023-10-04 ENCOUNTER — Ambulatory Visit: Payer: Self-pay

## 2023-10-04 ENCOUNTER — Ambulatory Visit (HOSPITAL_COMMUNITY)
Admission: RE | Admit: 2023-10-04 | Discharge: 2023-10-04 | Disposition: A | Discharge status: Home / Self Care | Source: Ambulatory Visit | Attending: Medical | Admitting: Medical

## 2023-10-04 DIAGNOSIS — I429 Cardiomyopathy, unspecified: Secondary | ICD-10-CM | POA: Diagnosis not present

## 2023-10-04 LAB — NM MYOCAR MULTI W/SPECT W/WALL MOTION / EF
Base ST Depression (mm): 0 mm
LV dias vol: 175 mL (ref 62–150)
LV sys vol: 103 mL
MPHR: 138 {beats}/min
Nuc Stress EF: 41 %
Peak HR: 88 {beats}/min
Percent HR: 63 %
RATE: 0.6
Rest HR: 64 {beats}/min
Rest Nuclear Isotope Dose: 11 mCi
SDS: 4
SRS: 8
SSS: 12
ST Depression (mm): 0 mm
Stress Nuclear Isotope Dose: 31 mCi
TID: 0.99

## 2023-10-04 LAB — BASIC METABOLIC PANEL WITH GFR
Anion gap: 7 (ref 5–15)
BUN: 28 mg/dL — ABNORMAL HIGH (ref 8–23)
CO2: 25 mmol/L (ref 22–32)
Calcium: 9.4 mg/dL (ref 8.9–10.3)
Chloride: 102 mmol/L (ref 98–111)
Creatinine, Ser: 1.52 mg/dL — ABNORMAL HIGH (ref 0.61–1.24)
GFR, Estimated: 45 mL/min — ABNORMAL LOW (ref >60)
Glucose, Bld: 92 mg/dL (ref 70–99)
Potassium: 4.1 mmol/L (ref 3.5–5.1)
Sodium: 134 mmol/L — ABNORMAL LOW (ref 135–145)

## 2023-10-04 MED ORDER — TECHNETIUM TC 99M TETROFOSMIN IV KIT
31.0000 | PACK | Freq: Once | INTRAVENOUS | Status: AC | PRN
  Administered 2023-10-04: 31 via INTRAVENOUS

## 2023-10-04 MED ORDER — TECHNETIUM TC 99M TETROFOSMIN IV KIT
11.0000 | PACK | Freq: Once | INTRAVENOUS | Status: AC | PRN
  Administered 2023-10-04: 11 via INTRAVENOUS

## 2023-10-04 MED ORDER — REGADENOSON 0.4 MG/5ML IV SOLN
INTRAVENOUS | Status: AC
  Administered 2023-10-04: 0.4 mg via INTRAVENOUS
  Filled 2023-10-04: qty 5

## 2023-10-04 MED ORDER — SODIUM CHLORIDE FLUSH 0.9 % IV SOLN
INTRAVENOUS | Status: AC
  Administered 2023-10-04: 10 mL via INTRAVENOUS
  Filled 2023-10-04: qty 10

## 2023-10-05 ENCOUNTER — Ambulatory Visit: Payer: Self-pay | Admitting: Medical

## 2023-10-16 NOTE — Progress Notes (Signed)
 Cardiology Office Note    Date:  10/18/2023  ID:  Larry Castillo, DOB 1940/09/13, MRN [id number] Cardiologist: Lasalle Pointer, MD    History of Present Illness:    Larry Castillo is a 83 y.o. male with past medical history of CAD (s/p CABG in 2011), aortic stenosis, post-operative atrial fibrillation (no longer on anticoagulation), HTN, HLD and Stage 3 CKD who presents to the office today for 27-month follow-up.   He was last examined by Cadence Furth, PA in 09/2023 and recent echocardiogram and shown his EF was reduced at 40% and he was noted to have moderate AS. He reported overall feeling well at that time and denied any recent chest pain or dyspnea on exertion. Given his CKD, a Lexiscan  Myoview  was recommended for further ischemic evaluation. He was also started on Jardiance  10 mg daily and continued on Amlodipine  10 mg daily, ASA 81 mg daily, Coreg  3.125 mg twice daily, Zetia  10mg  daily and Crestor  20mg  daily. Follow-up labs showed creatinine was stable at 1.52 and K+ was at 4.1. NST showed a small to medium sized inferior septal infarct with only very mild apical ischemia and was read as an intermediate risk study but risk was based primarily on decreased LVEF of 41%.  In talking with the patient today, he reports overall feeling well for his age. 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 He denies any progressive dyspnea on exertion when walking. No recent exertional chest pain or palpitations. No specific orthopnea, PND or pitting edema. He has developed what appears most consistent with ecchymosis along his upper abdominal area and reports a similar area along his upper right leg (declines examination of it at this time but says similar to his abdomen in appearance).  Unaware of any trauma to the area. He questions if this is possibly due to Jardiance  as this was a new medication for him at the time of his last office visit.  Studies Reviewed:   EKG: EKG is not ordered  today.  Echocardiogram: 08/2023 IMPRESSIONS     1. Left ventricular ejection fraction, by estimation, is 40%. The left  ventricle has moderately decreased function. The left ventricle  demonstrates global hypokinesis. Left ventricular diastolic parameters are  indeterminate. Elevated left atrial  pressure.   2. Right ventricular systolic function is normal. The right ventricular  size is normal.   3. Left atrial size was severely dilated.   4. The mitral valve is normal in structure. Trivial mitral valve  regurgitation. No evidence of mitral stenosis.   5. The aortic valve has an indeterminant number of cusps. There is mild  calcification of the aortic valve. There is mild thickening of the aortic  valve. Aortic valve regurgitation is trivial. Moderate aortic valve  stenosis. Aortic valve area, by VTI  measures 1.01 cm. Aortic valve mean gradient measures 24.0 mmHg.   NST: 10/2023   Findings are consistent with small to medium size inferoseptal infarction. There is small apical infarct with very mild ischemia. The study is intermediate risk. Risk based primarily on decreased LVEF. Limited current ischemia.   No ST deviation was noted.   LV perfusion is abnormal.   Left ventricular function is abnormal. Nuclear stress EF: 41%. The left ventricular ejection fraction is moderately decreased (30-44%). End diastolic cavity size is severely enlarged.  Risk Assessment/Calculations:     HYPERTENSION CONTROL Vitals:   10/18/23 1020 10/18/23 1044  BP: (!) 178/84 (!) 152/78    The patient's blood pressure  is elevated above target today.  In order to address the patient's elevated BP: Blood pressure will be monitored at home to determine if medication changes need to be made.       Physical Exam:   VS:  BP (!) 152/78   Pulse 75   Ht 6' (1.829 m)   Wt 221 lb 9.6 oz (100.5 kg)   SpO2 95%   BMI 30.05 kg/m    Wt Readings from Last 3 Encounters:  10/18/23 221 lb 9.6 oz (100.5 kg)   09/20/23 229 lb (103.9 kg)  07/13/23 219 lb (99.3 kg)     GEN: Well nourished, well developed male appearing in no acute distress NECK: No JVD; No carotid bruits CARDIAC: RRR, 2/6 SEM along RUSB.  RESPIRATORY:  Clear to auscultation without rales, wheezing or rhonchi  ABDOMEN: Appears non-distended. No obvious abdominal masses. Ecchymosis along epigastric region which appears in different stages of healing. No hematoma. EXTREMITIES: No clubbing or cyanosis. No pitting edema.  Distal pedal pulses are 2+ bilaterally.   Assessment and Plan:   1. Heart failure with mildly reduced ejection fraction (HFmrEF) (HCC) - Recent echocardiogram in 08/2023 showed his EF was reduced at 40% with global hypokinesis and NST showed only a small area of mild ischemia. - He has developed what appears most consistent with ecchymosis along his abdomen and right upper leg and this does not resemble cellulitis or candidiasis. Given that symptoms started within the past month with him being started on Jardiance  at that time, I did recommend that he hold Jardiance  for the next 2 weeks and let us  know how symptoms are doing at that time. If symptoms have resolved, would remain off SGLT2 inhibitor therapy. We discussed starting Entresto 24-26 mg twice daily today but he prefers to wait until a decision is made about the long-term use of Jardiance . Given his eechymosis, will recheck a CBC  Continue Coreg  3.125 mg twice daily.  2. Aortic valve stenosis, etiology of cardiac valve disease unspecified - He had moderate AS by recent echocardiogram in 08/2023. Would plan for follow-up imaging in 1 year for reassessment.   3. Essential hypertension, benign - BP was initially recorded at 178/84, rechecked and at 152/78. He reports this has overall been well-controlled at home and at other office visits. Continue current medical therapy for now with Amlodipine  10 mg daily and Coreg  3.125 mg twice daily. As discussed above, would  plan to add Losartan or Entresto following reassessment of symptoms.  4. CAD/Hyperlipidemia LDL goal <70 - He previously underwent CABG in 2011 and recent NST earlier this month showed only a small area of very mild ischemia. He remains active at baseline for his age and denies any recent anginal symptoms. - HLD is followed by his PCP and LDL was at 90 when checked in 05/2022. We will request a copy of his recent labs. Continue current medical therapy for now with Zetia  10 mg daily and Crestor  20 mg daily. If LDL remains above goal, would titrate Crestor  to 40mg  daily.   5. Stage 3a chronic kidney disease (HCC) - Creatinine was stable at 1.52 by recent labs on 10/04/2023 which is close to his known baseline.  Signed, Dorma Gash, PA-C

## 2023-10-18 ENCOUNTER — Ambulatory Visit: Attending: Student | Admitting: Student

## 2023-10-18 ENCOUNTER — Encounter: Payer: Self-pay | Admitting: Student

## 2023-10-18 VITALS — BP 152/78 | HR 75 | Ht 72.0 in | Wt 221.6 lb

## 2023-10-18 DIAGNOSIS — I2510 Atherosclerotic heart disease of native coronary artery without angina pectoris: Secondary | ICD-10-CM | POA: Diagnosis not present

## 2023-10-18 DIAGNOSIS — Z79899 Other long term (current) drug therapy: Secondary | ICD-10-CM | POA: Diagnosis not present

## 2023-10-18 DIAGNOSIS — I350 Nonrheumatic aortic (valve) stenosis: Secondary | ICD-10-CM | POA: Diagnosis not present

## 2023-10-18 DIAGNOSIS — I5022 Chronic systolic (congestive) heart failure: Secondary | ICD-10-CM | POA: Diagnosis not present

## 2023-10-18 DIAGNOSIS — I10 Essential (primary) hypertension: Secondary | ICD-10-CM | POA: Diagnosis not present

## 2023-10-18 DIAGNOSIS — N1831 Chronic kidney disease, stage 3a: Secondary | ICD-10-CM | POA: Diagnosis not present

## 2023-10-18 DIAGNOSIS — E785 Hyperlipidemia, unspecified: Secondary | ICD-10-CM

## 2023-10-18 NOTE — Patient Instructions (Signed)
 Medication Instructions:   Hold Jardiance   for 2 weeks and call with symptoms   *If you need a refill on your cardiac medications before your next appointment, please call your pharmacy*  Lab Work: Your physician recommends that you return for lab work in: CBC  If you have labs (blood work) drawn today and your tests are completely normal, you will receive your results only by: MyChart Message (if you have MyChart) OR A paper copy in the mail If you have any lab test that is abnormal or we need to change your treatment, we will call you to review the results.  Testing/Procedures: NONE   Follow-Up: At [facility], you and your health needs are our priority.  As part of our continuing mission to provide you with exceptional heart care, our providers are all part of one team.  This team includes your primary Cardiologist (physician) and Advanced Practice Providers or APPs (Physician Assistants and Nurse Practitioners) who all work together to provide you with the care you need, when you need it.  Your next appointment:   2 month(s)  Provider:   You may see Vishnu P Mallipeddi, MD or the following Advanced Practice Provider on your designated Care Team:   Lasalle Pointer, NP    We recommend signing up for the patient portal called MyChart.  Sign up information is provided on this After Visit Summary.  MyChart is used to connect with patients for Virtual Visits (Telemedicine).  Patients are able to view lab/test results, encounter notes, upcoming appointments, etc.  Non-urgent messages can be sent to your provider as well.   To learn more about what you can do with MyChart, go to [email or web address].   Other Instructions Thank you for choosing [facility]!

## 2023-11-01 ENCOUNTER — Telehealth: Payer: Self-pay

## 2023-11-01 DIAGNOSIS — Z0001 Encounter for general adult medical examination with abnormal findings: Secondary | ICD-10-CM | POA: Diagnosis not present

## 2023-11-01 DIAGNOSIS — Z131 Encounter for screening for diabetes mellitus: Secondary | ICD-10-CM | POA: Diagnosis not present

## 2023-11-01 DIAGNOSIS — N189 Chronic kidney disease, unspecified: Secondary | ICD-10-CM | POA: Diagnosis not present

## 2023-11-01 DIAGNOSIS — E7800 Pure hypercholesterolemia, unspecified: Secondary | ICD-10-CM | POA: Diagnosis not present

## 2023-11-01 DIAGNOSIS — I10 Essential (primary) hypertension: Secondary | ICD-10-CM | POA: Diagnosis not present

## 2023-11-01 NOTE — Telephone Encounter (Signed)
 Patient had labs this am at [facility], will request

## 2023-11-01 NOTE — Telephone Encounter (Signed)
 Walk in patient who says he just had cbc done because the pharmacist told him the bruising he noted on his abdomen could be from taking ASA. He originally thought it was from Jardiance  but pharmacist at [facility] drug said no.He said he never held the Jardiance .   CBC is pending.

## 2023-11-08 DIAGNOSIS — E7800 Pure hypercholesterolemia, unspecified: Secondary | ICD-10-CM | POA: Diagnosis not present

## 2023-11-08 DIAGNOSIS — H409 Unspecified glaucoma: Secondary | ICD-10-CM | POA: Diagnosis not present

## 2023-11-08 DIAGNOSIS — I2510 Atherosclerotic heart disease of native coronary artery without angina pectoris: Secondary | ICD-10-CM | POA: Diagnosis not present

## 2023-11-08 DIAGNOSIS — N189 Chronic kidney disease, unspecified: Secondary | ICD-10-CM | POA: Diagnosis not present

## 2023-11-08 DIAGNOSIS — D649 Anemia, unspecified: Secondary | ICD-10-CM | POA: Diagnosis not present

## 2023-11-29 DIAGNOSIS — I10 Essential (primary) hypertension: Secondary | ICD-10-CM | POA: Diagnosis not present

## 2023-11-29 DIAGNOSIS — Z0001 Encounter for general adult medical examination with abnormal findings: Secondary | ICD-10-CM | POA: Diagnosis not present

## 2023-11-29 DIAGNOSIS — N189 Chronic kidney disease, unspecified: Secondary | ICD-10-CM | POA: Diagnosis not present

## 2023-11-29 DIAGNOSIS — E7800 Pure hypercholesterolemia, unspecified: Secondary | ICD-10-CM | POA: Diagnosis not present

## 2023-12-13 DIAGNOSIS — N189 Chronic kidney disease, unspecified: Secondary | ICD-10-CM | POA: Diagnosis not present

## 2023-12-13 DIAGNOSIS — I2510 Atherosclerotic heart disease of native coronary artery without angina pectoris: Secondary | ICD-10-CM | POA: Diagnosis not present

## 2023-12-13 DIAGNOSIS — H409 Unspecified glaucoma: Secondary | ICD-10-CM | POA: Diagnosis not present

## 2023-12-13 DIAGNOSIS — E7800 Pure hypercholesterolemia, unspecified: Secondary | ICD-10-CM | POA: Diagnosis not present

## 2023-12-20 ENCOUNTER — Encounter: Payer: Self-pay | Admitting: Nurse Practitioner

## 2023-12-20 ENCOUNTER — Ambulatory Visit: Attending: Nurse Practitioner | Admitting: Nurse Practitioner

## 2023-12-20 VITALS — BP 130/80 | HR 55 | Ht 72.0 in | Wt 227.6 lb

## 2023-12-20 DIAGNOSIS — I10 Essential (primary) hypertension: Secondary | ICD-10-CM

## 2023-12-20 DIAGNOSIS — I2510 Atherosclerotic heart disease of native coronary artery without angina pectoris: Secondary | ICD-10-CM | POA: Diagnosis not present

## 2023-12-20 DIAGNOSIS — I350 Nonrheumatic aortic (valve) stenosis: Secondary | ICD-10-CM | POA: Diagnosis not present

## 2023-12-20 DIAGNOSIS — I5022 Chronic systolic (congestive) heart failure: Secondary | ICD-10-CM | POA: Diagnosis not present

## 2023-12-20 DIAGNOSIS — N189 Chronic kidney disease, unspecified: Secondary | ICD-10-CM | POA: Diagnosis not present

## 2023-12-20 DIAGNOSIS — E785 Hyperlipidemia, unspecified: Secondary | ICD-10-CM | POA: Diagnosis not present

## 2023-12-20 NOTE — Patient Instructions (Signed)
 Medication Instructions:  Your physician recommends that you continue on your current medications as directed. Please refer to the Current Medication list given to you today.  *If you need a refill on your cardiac medications before your next appointment, please call your pharmacy*  Lab Work: None   If you have labs (blood work) drawn today and your tests are completely normal, you will receive your results only by: MyChart Message (if you have MyChart) OR A paper copy in the mail If you have any lab test that is abnormal or we need to change your treatment, we will call you to review the results.  Testing/Procedures: None   Follow-Up: At [facility], you and your health needs are our priority.  As part of our continuing mission to provide you with exceptional heart care, our providers are all part of one team.  This team includes your primary Cardiologist (physician) and Advanced Practice Providers or APPs (Physician Assistants and Nurse Practitioners) who all work together to provide you with the care you need, when you need it.  Your next appointment:   4 -6 week(s)  Provider:   You may see Vishnu P Mallipeddi, MD or one of the following Advanced Practice Providers on your designated Care Team:   [place], PA-C  [place], [place] Olivia [place], [place]     We recommend signing up for the patient portal called MyChart.  Sign up information is provided on this After Visit Summary.  MyChart is used to connect with patients for Virtual Visits (Telemedicine).  Patients are able to view lab/test results, encounter notes, upcoming appointments, etc.  Non-urgent messages can be sent to your provider as well.   To learn more about what you can do with MyChart, go to [email or web address].   Other Instructions Thank you for choosing [facility]!

## 2023-12-20 NOTE — Progress Notes (Signed)
 Cardiology Office Note   Date:  12/20/2023 ID:  Larry Castillo, DOB Jan 19, 1941, MRN [id number] PCP: Trudy Vaughn FALCON, MD  [facility] Providers Cardiologist:  Diannah SHAUNNA Maywood, MD     History of Present Illness Larry Castillo is a 83 y.o. male with a PMH of CAD, s/p CABG in 2011, hyperlipidemia, hypertension, postop A-fib, aortic valve stenosis, CKD stage III, who presents today for 19-month follow-up.  Last seen by [name], PA-C on October 18, 2023.  He was overall feeling well.  It was noted at that time he had developed some consistent ecchymosis along upper abdominal area, similar area along upper right leg.  Denied any trauma/falls.  He was instructed to hold Jardiance  for 2 weeks and then update us  regarding his symptoms.  It was recommended if if his symptoms had resolved, would remain off SGLT2 inhibitor therapy.  Did discuss starting low-dose Entresto, patient preferred to wait.  Today he presents for follow-up. He states he is doing very well.  He does tell me that his primary care doctor took him off of Jardiance  due to his kidney function.  He shows me his most recent labs that show creatinine trends from around 1.95-1.4.  He wants to speak further with his nephrologist before making a decision regarding Jardiance  as well as Entresto. Denies any chest pain, shortness of breath, palpitations, syncope, presyncope, dizziness, orthopnea, PND, swelling or significant weight changes, acute bleeding, or claudication.  He tells me that his bruising has resolved.  ROS: Negative.  See HPI.  Studies Reviewed  EKG: EKG is not ordered at this time.  Lexiscan  10/2023:   Findings are consistent with small to medium size inferoseptal infarction. There is small apical infarct with very mild ischemia. The study is intermediate risk. Risk based primarily on decreased LVEF. Limited current ischemia.   No ST deviation was noted.   LV perfusion is abnormal.   Left ventricular function  is abnormal. Nuclear stress EF: 41%. The left ventricular ejection fraction is moderately decreased (30-44%). End diastolic cavity size is severely enlarged.  Echo 08/2023: 1. Left ventricular ejection fraction, by estimation, is 40%. The left  ventricle has moderately decreased function. The left ventricle  demonstrates global hypokinesis. Left ventricular diastolic parameters are  indeterminate. Elevated left atrial  pressure.   2. Right ventricular systolic function is normal. The right ventricular  size is normal.   3. Left atrial size was severely dilated.   4. The mitral valve is normal in structure. Trivial mitral valve  regurgitation. No evidence of mitral stenosis.   5. The aortic valve has an indeterminant number of cusps. There is mild  calcification of the aortic valve. There is mild thickening of the aortic  valve. Aortic valve regurgitation is trivial. Moderate aortic valve  stenosis. Aortic valve area, by VTI  measures 1.01 cm. Aortic valve mean gradient measures 24.0 mmHg.      Physical Exam VS:  BP 130/80 (BP Location: Left Arm, Cuff Size: Normal)   Pulse (!) 55   Ht 6' (1.829 m)   Wt 227 lb 9.6 oz (103.2 kg)   SpO2 98%   BMI 30.87 kg/m        Wt Readings from Last 3 Encounters:  12/20/23 227 lb 9.6 oz (103.2 kg)  10/18/23 221 lb 9.6 oz (100.5 kg)  09/20/23 229 lb (103.9 kg)    GEN: Well nourished, well developed in no acute distress NECK: No JVD; No carotid bruits CARDIAC: S1/S2, RRR, Grade  2/6 murmur, no rubs, no gallops RESPIRATORY:  Clear to auscultation without rales, wheezing or rhonchi  ABDOMEN: Soft, non-tender, non-distended EXTREMITIES:  No edema; No deformity   ASSESSMENT AND PLAN  HFmrEF Stage C, NYHA class I symptoms.  Echocardiogram from April 2025 showed EF 40%. Euvolemic and well compensated on exam.  Continue carvedilol .  PCP recently took him off of Jardiance  and wants to wait to make a decision regarding Jardiance  and Entresto until  further seen by nephrologist.  No medication changes at this time per patient's request. Low sodium diet, fluid restriction <2L, and daily weights encouraged. Educated to contact our office for weight gain of 2 lbs overnight or 5 lbs in one week.  CAD, s/p CABG in 2011 Stable with no anginal symptoms. No indication for ischemic evaluation.  Continue aspirin, carvedilol , Zetia , and rosuvastatin . Heart healthy diet and regular cardiovascular exercise encouraged.   Hypertension Blood pressure is stable and at goal. Discussed to monitor BP at home at least 2 hours after medications and sitting for 5-10 minutes.  No medication changes at this time. Heart healthy diet and regular cardiovascular exercise encouraged.   Hyperlipidemia No recent lipid panel on file. Will request this at next office visit.  No medication changes at this time. Heart healthy diet and regular cardiovascular exercise encouraged.   Moderate aortic valve stenosis Moderate aortic valve stenosis noted on echocardiogram from earlier this year with mean gradient measuring 24.0 mmHg.  He denies any concerning signs or symptoms at this time.  Will continue to monitor and recommend updating echocardiogram at next year.  CKD  Most recent kidney function stable.  No medication changes at this time and will avoid nephrotoxic agents.  Continue follow-up with nephrology.  I spent a total duration of 30 minutes reviewing prior notes, reviewing outside records including  labs,, face-to-face counseling of  medical condition, pathophysiology, evaluation, management, and documenting the findings in the note.    Dispo: Follow-up with MD/APP in 1 month for possible GDMT titration or sooner if anything changes.  Signed, Almarie Crate, NP

## 2023-12-26 DIAGNOSIS — I350 Nonrheumatic aortic (valve) stenosis: Secondary | ICD-10-CM | POA: Diagnosis not present

## 2023-12-26 DIAGNOSIS — N2581 Secondary hyperparathyroidism of renal origin: Secondary | ICD-10-CM | POA: Diagnosis not present

## 2023-12-26 DIAGNOSIS — D631 Anemia in chronic kidney disease: Secondary | ICD-10-CM | POA: Diagnosis not present

## 2023-12-26 DIAGNOSIS — N1831 Chronic kidney disease, stage 3a: Secondary | ICD-10-CM | POA: Diagnosis not present

## 2023-12-26 DIAGNOSIS — I5020 Unspecified systolic (congestive) heart failure: Secondary | ICD-10-CM | POA: Diagnosis not present

## 2023-12-26 DIAGNOSIS — I129 Hypertensive chronic kidney disease with stage 1 through stage 4 chronic kidney disease, or unspecified chronic kidney disease: Secondary | ICD-10-CM | POA: Diagnosis not present

## 2023-12-27 DIAGNOSIS — N1831 Chronic kidney disease, stage 3a: Secondary | ICD-10-CM | POA: Diagnosis not present

## 2023-12-29 ENCOUNTER — Telehealth: Payer: Self-pay | Admitting: Internal Medicine

## 2023-12-29 NOTE — Telephone Encounter (Signed)
 Patient is calling in to see if the dr wanted to try him on the new medication for his heart value. And if so can we call it into the the pharmacy. Please advise

## 2023-12-30 NOTE — Telephone Encounter (Signed)
 Advised that medication management and follow up will be addressed at his next visit 02/14/2024. Verbalized understanding.

## 2024-01-04 ENCOUNTER — Other Ambulatory Visit (HOSPITAL_COMMUNITY): Payer: Self-pay | Admitting: Nephrology

## 2024-01-04 DIAGNOSIS — N1831 Chronic kidney disease, stage 3a: Secondary | ICD-10-CM

## 2024-02-14 ENCOUNTER — Encounter: Payer: Self-pay | Admitting: Nurse Practitioner

## 2024-02-14 ENCOUNTER — Ambulatory Visit: Attending: Nurse Practitioner | Admitting: Nurse Practitioner

## 2024-02-14 VITALS — BP 172/78 | HR 70 | Ht 72.0 in | Wt 229.4 lb

## 2024-02-14 DIAGNOSIS — N189 Chronic kidney disease, unspecified: Secondary | ICD-10-CM

## 2024-02-14 DIAGNOSIS — I10 Essential (primary) hypertension: Secondary | ICD-10-CM | POA: Diagnosis not present

## 2024-02-14 DIAGNOSIS — E785 Hyperlipidemia, unspecified: Secondary | ICD-10-CM | POA: Diagnosis not present

## 2024-02-14 DIAGNOSIS — I350 Nonrheumatic aortic (valve) stenosis: Secondary | ICD-10-CM | POA: Diagnosis not present

## 2024-02-14 DIAGNOSIS — I2510 Atherosclerotic heart disease of native coronary artery without angina pectoris: Secondary | ICD-10-CM | POA: Diagnosis not present

## 2024-02-14 DIAGNOSIS — I5020 Unspecified systolic (congestive) heart failure: Secondary | ICD-10-CM | POA: Diagnosis not present

## 2024-02-14 MED ORDER — CARVEDILOL 6.25 MG PO TABS: 6.2500 mg | ORAL_TABLET | Freq: Two times a day (BID) | ORAL | 0 refills | Status: DC

## 2024-02-14 NOTE — Progress Notes (Unsigned)
 Cardiology Office Note   Date: 02/14/2024 ID:  Larry Castillo, DOB [birth year], MRN [id number] PCP: Trudy Vaughn FALCON, MD  [facility] Providers Cardiologist:  Diannah SHAUNNA [place], MD     History of Present Illness Larry Castillo is a 83 y.o. male with a PMH of CAD, s/p CABG in 2011, hyperlipidemia, hypertension, postop A-fib, aortic valve stenosis, CKD stage III, who presents today for 4-6 week follow-up.  Last seen by [name], PA-C on October 18, 2023.  He was overall feeling well.  It was noted at that time he had developed some consistent ecchymosis along upper abdominal area, similar area along upper right leg.  Denied any trauma/falls.  He was instructed to hold Jardiance  for 2 weeks and then update us  regarding his symptoms.  It was recommended if if his symptoms had resolved, would remain off SGLT2 inhibitor therapy.  Did discuss starting low-dose Entresto, patient preferred to wait.  12/20/2023 - Today he presents for follow-up. He states he is doing very well.  He does tell me that his primary care doctor took him off of Jardiance  due to his kidney function.  He shows me his most recent labs that show creatinine trends from around 1.95-1.4.  He wants to speak further with his nephrologist before making a decision regarding Jardiance  as well as Entresto. Denies any chest pain, shortness of breath, palpitations, syncope, presyncope, dizziness, orthopnea, PND, swelling or significant weight changes, acute bleeding, or claudication.  He tells me that his bruising has resolved.  02/14/2024 - Here for follow-up. Wants to know why he is taking Aspirin when he says he knows he should be avoiding NSAIDs with his chronic kidney disease.  Tells me he would like to come off aspirin if he can. Overall doing well. Denies any chest pain, shortness of breath, palpitations, syncope, presyncope, dizziness, orthopnea, PND, swelling or significant weight changes, acute bleeding, or  claudication.  ROS: Negative.  See HPI.  Studies Reviewed  EKG: EKG is not ordered at this time.  Lexiscan  10/2023:   Findings are consistent with small to medium size inferoseptal infarction. There is small apical infarct with very mild ischemia. The study is intermediate risk. Risk based primarily on decreased LVEF. Limited current ischemia.   No ST deviation was noted.   LV perfusion is abnormal.   Left ventricular function is abnormal. Nuclear stress EF: 41%. The left ventricular ejection fraction is moderately decreased (30-44%). End diastolic cavity size is severely enlarged.  Echo 08/2023: 1. Left ventricular ejection fraction, by estimation, is 40%. The left  ventricle has moderately decreased function. The left ventricle  demonstrates global hypokinesis. Left ventricular diastolic parameters are  indeterminate. Elevated left atrial  pressure.   2. Right ventricular systolic function is normal. The right ventricular  size is normal.   3. Left atrial size was severely dilated.   4. The mitral valve is normal in structure. Trivial mitral valve  regurgitation. No evidence of mitral stenosis.   5. The aortic valve has an indeterminant number of cusps. There is mild  calcification of the aortic valve. There is mild thickening of the aortic  valve. Aortic valve regurgitation is trivial. Moderate aortic valve  stenosis. Aortic valve area, by VTI  measures 1.01 cm. Aortic valve mean gradient measures 24.0 mmHg.      Physical Exam VS:  BP (!) 172/78 (BP Location: Right Arm)   Pulse 70   Ht 6' (1.829 m)   Wt 229 lb 6.4 oz (104.1 kg)  SpO2 96%   BMI 31.11 kg/m        Wt Readings from Last 3 Encounters:  02/14/24 229 lb 6.4 oz (104.1 kg)  12/20/23 227 lb 9.6 oz (103.2 kg)  10/18/23 221 lb 9.6 oz (100.5 kg)    Repeat BP: 160/89   GEN: Well nourished, well developed in no acute distress NECK: No JVD; No carotid bruits CARDIAC: S1/S2, RRR, Grade 2/6 murmur, no rubs, no  gallops RESPIRATORY:  Clear to auscultation without rales, wheezing or rhonchi  ABDOMEN: Soft, non-tender, non-distended EXTREMITIES:  No edema; No deformity   ASSESSMENT AND PLAN  HFmrEF Stage C, NYHA class I symptoms.  Echocardiogram from April 2025 showed EF 40%. Euvolemic and well compensated on exam.  Instructed him to increase Coreg  to 6.25 mg twice daily.  Continue rest of medication regimen. Low sodium diet, fluid restriction <2L, and daily weights encouraged. Educated to contact our office for weight gain of 2 lbs overnight or 5 lbs in one week.   CAD, s/p CABG in 2011 Stable with no anginal symptoms. No indication for ischemic evaluation.  Continue aspirin, Zetia , and rosuvastatin .  Instructed him about purpose of aspirin and to discuss more with his nephrologist.  Will increase Coreg  as noted above.  Heart healthy diet and regular cardiovascular exercise encouraged.   Hypertension Blood pressure is elevated and not at goal. Discussed to monitor BP at home at least 2 hours after medications and sitting for 5-10 minutes.  Increasing Coreg  as noted above.  No medication changes at this time. Heart healthy diet and regular cardiovascular exercise encouraged.   Hyperlipidemia No recent lipid panel on file. Will request this at next office visit.  No medication changes at this time. Heart healthy diet and regular cardiovascular exercise encouraged.   Moderate aortic valve stenosis Moderate aortic valve stenosis noted on echocardiogram from earlier this year with mean gradient measuring 24.0 mmHg.  He denies any concerning signs or symptoms at this time.  Will continue to monitor and recommend updating echocardiogram at next year.  CKD  Most recent kidney function relatively.  No medication changes at this time and will avoid nephrotoxic agents.  Continue follow-up with nephrology.  I spent a total duration of 30 minutes reviewing prior notes, reviewing outside records including  labs,,  face-to-face counseling of  medical condition, pathophysiology, evaluation, management, and documenting the findings in the note.    Dispo: Follow-up with MD/APP in 6-8 weeks for possible GDMT titration or sooner if anything changes.  Signed, Almarie Crate, NP

## 2024-02-14 NOTE — Patient Instructions (Addendum)
 Medication Instructions:  Your physician has recommended you make the following change in your medication:  Please Increase carvedilol  to 6.25 Mg Twice daily   Labwork: None   Testing/Procedures: None   Follow-Up: Your physician recommends that you schedule a follow-up appointment in: 6-8 weeks   Any Other Special Instructions Will Be Listed Below (If Applicable).  If you need a refill on your cardiac medications before your next appointment, please call your pharmacy.          (Patient Name)-_____________________________  (MRN)-_________________________     (Physician)-_________________________________   (DATE) -_________ (Blood Pressure)-_________  (Heart Rate)-_________    (DATE) -_________ (Blood Pressure)-_________  (Heart Rate)-_________    (DATE) -_________ (Blood Pressure)-_________  (Heart Rate)-_________   (DATE) -_________ (Blood Pressure)-_________  (Heart Rate)-_________    (DATE) -_________ (Blood Pressure)-_________  (Heart Rate)-_________    (DATE) -_________ (Blood Pressure)-_________  (Heart Rate)-_________   (DATE) -_________ (Blood Pressure)-_________  (Heart Rate)-_________    (DATE) -_________ (Blood Pressure)-_________  (Heart Rate)-_________    (DATE) -_________ (Blood Pressure)-_________  (Heart Rate)-_________    (DATE) -_________ (Blood Pressure)-_________  (Heart Rate)-_________    (DATE) -_________ (Blood Pressure)-_________  (Heart Rate)-_________    (DATE) -_________ (Blood Pressure)-_________  (Heart Rate)-_________    (DATE) -_________ (Blood Pressure)-_________  (Heart Rate)-_________    (DATE) -_________ (Blood Pressure)-_________  (Heart Rate)-_________

## 2024-02-28 DIAGNOSIS — R7303 Prediabetes: Secondary | ICD-10-CM | POA: Diagnosis not present

## 2024-02-28 DIAGNOSIS — N183 Chronic kidney disease, stage 3 unspecified: Secondary | ICD-10-CM | POA: Diagnosis not present

## 2024-02-28 DIAGNOSIS — Z0001 Encounter for general adult medical examination with abnormal findings: Secondary | ICD-10-CM | POA: Diagnosis not present

## 2024-02-28 DIAGNOSIS — I10 Essential (primary) hypertension: Secondary | ICD-10-CM | POA: Diagnosis not present

## 2024-02-28 DIAGNOSIS — N189 Chronic kidney disease, unspecified: Secondary | ICD-10-CM | POA: Diagnosis not present

## 2024-02-28 DIAGNOSIS — I2510 Atherosclerotic heart disease of native coronary artery without angina pectoris: Secondary | ICD-10-CM | POA: Diagnosis not present

## 2024-02-28 DIAGNOSIS — D649 Anemia, unspecified: Secondary | ICD-10-CM | POA: Diagnosis not present

## 2024-04-10 ENCOUNTER — Ambulatory Visit: Admitting: Nurse Practitioner

## 2024-04-10 ENCOUNTER — Ambulatory Visit: Attending: Nurse Practitioner | Admitting: Nurse Practitioner

## 2024-04-10 ENCOUNTER — Encounter: Payer: Self-pay | Admitting: Nurse Practitioner

## 2024-04-10 VITALS — BP 162/78 | HR 66 | Ht 72.0 in | Wt 235.2 lb

## 2024-04-10 DIAGNOSIS — N189 Chronic kidney disease, unspecified: Secondary | ICD-10-CM

## 2024-04-10 DIAGNOSIS — I2510 Atherosclerotic heart disease of native coronary artery without angina pectoris: Secondary | ICD-10-CM

## 2024-04-10 DIAGNOSIS — I5020 Unspecified systolic (congestive) heart failure: Secondary | ICD-10-CM

## 2024-04-10 DIAGNOSIS — I10 Essential (primary) hypertension: Secondary | ICD-10-CM

## 2024-04-10 DIAGNOSIS — Z79899 Other long term (current) drug therapy: Secondary | ICD-10-CM

## 2024-04-10 DIAGNOSIS — I350 Nonrheumatic aortic (valve) stenosis: Secondary | ICD-10-CM

## 2024-04-10 DIAGNOSIS — E785 Hyperlipidemia, unspecified: Secondary | ICD-10-CM

## 2024-04-10 NOTE — Patient Instructions (Signed)
 Medication Instructions:   Continue all current medications.   Labwork:  FLP, LFT - orders given today Please do in 4-6 weeks  Reminder:  Nothing to eat or drink after 12 midnight prior to labs. Office will contact with results via phone, letter or mychart.     Testing/Procedures:  none  Follow-Up:  3 months   Any Other Special Instructions Will Be Listed Below (If Applicable).   If you need a refill on your cardiac medications before your next appointment, please call your pharmacy.

## 2024-04-10 NOTE — Progress Notes (Signed)
 Cardiology Office Note   Date: 04/10/2024 ID:  [name], DOB 08-Feb-1941, MRN [id number] PCP: Trudy Vaughn FALCON, MD  [facility] Providers Cardiologist:  Diannah SHAUNNA Maywood, MD     History of Present Illness Larry Castillo is a 83 y.o. male with a PMH of CAD, s/p CABG in 2011, hyperlipidemia, hypertension, postop A-fib, aortic valve stenosis, CKD stage III, who presents today for scheduled follow-up.  Last seen by [name], PA-C on October 18, 2023.  He was overall feeling well.  It was noted at that time he had developed some consistent ecchymosis along upper abdominal area, similar area along upper right leg.  Denied any trauma/falls.  He was instructed to hold Jardiance  for 2 weeks and then update us  regarding his symptoms.  It was recommended if if his symptoms had resolved, would remain off SGLT2 inhibitor therapy.  Did discuss starting low-dose Entresto, patient preferred to wait.  12/20/2023 - Today he presents for follow-up. He states he is doing very well.  He does tell me that his primary care doctor took him off of Jardiance  due to his kidney function.  He shows me his most recent labs that show creatinine trends from around 1.95-1.4.  He wants to speak further with his nephrologist before making a decision regarding Jardiance  as well as Entresto. Denies any chest pain, shortness of breath, palpitations, syncope, presyncope, dizziness, orthopnea, PND, swelling or significant weight changes, acute bleeding, or claudication.  He tells me that his bruising has resolved.  02/14/2024 - Here for follow-up. Wants to know why he is taking Aspirin when he says he knows he should be avoiding NSAIDs with his chronic kidney disease.  Tells me he would like to come off aspirin if he can. Overall doing well. Denies any chest pain, shortness of breath, palpitations, syncope, presyncope, dizziness, orthopnea, PND, swelling or significant weight changes, acute bleeding, or  claudication.  04/10/2024 -doing very well.  He tells me that he is going to be seeing his nephrologist next Monday will be getting blood work done today.  Considering going back on Jardiance .  Presents his BP log from home that shows overall well-controlled BPs SBP averaging 130s.  Heart rate is very well-controlled.  Tolerating his medications well.  Denies any chest pain, shortness of breath, palpitations, syncope, presyncope, dizziness, orthopnea, PND, swelling or significant weight changes, acute bleeding, or claudication.  ROS: Negative.  See HPI.  Studies Reviewed  EKG: EKG is not ordered at this time.  Lexiscan  10/2023:   Findings are consistent with small to medium size inferoseptal infarction. There is small apical infarct with very mild ischemia. The study is intermediate risk. Risk based primarily on decreased LVEF. Limited current ischemia.   No ST deviation was noted.   LV perfusion is abnormal.   Left ventricular function is abnormal. Nuclear stress EF: 41%. The left ventricular ejection fraction is moderately decreased (30-44%). End diastolic cavity size is severely enlarged.  Echo 08/2023: 1. Left ventricular ejection fraction, by estimation, is 40%. The left  ventricle has moderately decreased function. The left ventricle  demonstrates global hypokinesis. Left ventricular diastolic parameters are  indeterminate. Elevated left atrial  pressure.   2. Right ventricular systolic function is normal. The right ventricular  size is normal.   3. Left atrial size was severely dilated.   4. The mitral valve is normal in structure. Trivial mitral valve  regurgitation. No evidence of mitral stenosis.   5. The aortic valve has an indeterminant number  of cusps. There is mild  calcification of the aortic valve. There is mild thickening of the aortic  valve. Aortic valve regurgitation is trivial. Moderate aortic valve  stenosis. Aortic valve area, by VTI  measures 1.01 cm. Aortic valve  mean gradient measures 24.0 mmHg.      Physical Exam VS:  BP (!) 162/78   Pulse 66   Ht 6' (1.829 m)   Wt 235 lb 3.2 oz (106.7 kg)   SpO2 96%   BMI 31.90 kg/m        Wt Readings from Last 3 Encounters:  04/10/24 235 lb 3.2 oz (106.7 kg)  02/14/24 229 lb 6.4 oz (104.1 kg)  12/20/23 227 lb 9.6 oz (103.2 kg)     GEN: Well nourished, well developed in no acute distress NECK: No JVD; No carotid bruits CARDIAC: S1/S2, RRR, Grade 2/6 murmur, no rubs, no gallops RESPIRATORY:  Clear to auscultation without rales, wheezing or rhonchi  ABDOMEN: Soft, non-tender, non-distended EXTREMITIES:  No edema; No deformity   ASSESSMENT AND PLAN  HFmrEF Stage C, NYHA class I symptoms.  Echocardiogram from April 2025 showed EF 40%. Euvolemic and well compensated on exam.  Did discuss GDMT titration, patient is considering going back on Jardiance  per visit with nephrology that his soon.  He will be getting blood work done at this visit.  Patient would like to hold off at this time.  I have requested that these blood work results is sent to our office.  Continue rest of medication regimen. At next office visit, will consider starting Kerendia. Low sodium diet, fluid restriction <2L, and daily weights encouraged. Educated to contact our office for weight gain of 2 lbs overnight or 5 lbs in one week.   CAD, s/p CABG in 2011 Stable with no anginal symptoms. No indication for ischemic evaluation.  Continue current medication regimen.  Instructed him about purpose of aspirin. Heart healthy diet and regular cardiovascular exercise encouraged.   Hypertension Blood pressure is elevated and not at goal, however home BPs are at goal, does tend to have some whitecoat hypertension.  Discussed to monitor BP at home at least 2 hours after medications and sitting for 5-10 minutes.   No medication changes at this time. Heart healthy diet and regular cardiovascular exercise encouraged.   Hyperlipidemia, medication  management No recent lipid panel on file. Will obtain FLP/LFT prior to next office visit.  No medication changes at this time. Heart healthy diet and regular cardiovascular exercise encouraged.   Moderate aortic valve stenosis Moderate aortic valve stenosis noted on echocardiogram from earlier this year with mean gradient measuring 24.0 mmHg.  He denies any concerning signs or symptoms at this time.  Will continue to monitor and recommend updating echocardiogram April of 2026.   CKD  Most recent kidney function relatively stable.  No medication changes at this time and will avoid nephrotoxic agents.  Continue follow-up with nephrology.  I spent a total duration of 30 minutes reviewing prior notes, reviewing outside records including  labs, face-to-face counseling of  medical condition, pathophysiology, evaluation, management, and documenting the findings in the note.    Dispo: Follow-up with MD/APP in 3 months for possible GDMT titration or sooner if anything changes.  Signed, Almarie Crate, NP

## 2024-04-17 ENCOUNTER — Other Ambulatory Visit (HOSPITAL_COMMUNITY): Payer: Self-pay | Admitting: Nephrology

## 2024-04-17 ENCOUNTER — Other Ambulatory Visit (HOSPITAL_COMMUNITY)
Admission: RE | Admit: 2024-04-17 | Discharge: 2024-04-17 | Disposition: A | Source: Ambulatory Visit | Attending: Nephrology | Admitting: Nephrology

## 2024-04-17 DIAGNOSIS — N1832 Chronic kidney disease, stage 3b: Secondary | ICD-10-CM | POA: Insufficient documentation

## 2024-04-17 DIAGNOSIS — N1831 Chronic kidney disease, stage 3a: Secondary | ICD-10-CM

## 2024-04-17 LAB — RENAL FUNCTION PANEL
Albumin: 4.4 g/dL (ref 3.5–5.0)
Anion gap: 6 (ref 5–15)
BUN: 19 mg/dL (ref 8–23)
CO2: 31 mmol/L (ref 22–32)
Calcium: 9.2 mg/dL (ref 8.9–10.3)
Chloride: 103 mmol/L (ref 98–111)
Creatinine, Ser: 1.68 mg/dL — ABNORMAL HIGH (ref 0.61–1.24)
GFR, Estimated: 40 mL/min — ABNORMAL LOW (ref >60)
Glucose, Bld: 84 mg/dL (ref 70–99)
Phosphorus: 3.2 mg/dL (ref 2.5–4.6)
Potassium: 4.5 mmol/L (ref 3.5–5.1)
Sodium: 140 mmol/L (ref 135–145)

## 2024-05-01 ENCOUNTER — Ambulatory Visit (HOSPITAL_COMMUNITY)
Admission: RE | Admit: 2024-05-01 | Discharge: 2024-05-01 | Disposition: A | Discharge status: Home / Self Care | Source: Ambulatory Visit | Attending: Nephrology | Admitting: Nephrology

## 2024-05-01 DIAGNOSIS — N1831 Chronic kidney disease, stage 3a: Secondary | ICD-10-CM | POA: Insufficient documentation

## 2024-05-16 ENCOUNTER — Other Ambulatory Visit: Payer: Self-pay | Admitting: Nurse Practitioner

## 2024-05-16 DIAGNOSIS — I5020 Unspecified systolic (congestive) heart failure: Secondary | ICD-10-CM

## 2024-05-16 DIAGNOSIS — I10 Essential (primary) hypertension: Secondary | ICD-10-CM

## 2024-05-16 NOTE — Telephone Encounter (Signed)
 In accordance with refill protocols, please review and address the following requirements before this medication refill can be authorized:  Labs

## 2024-05-22 ENCOUNTER — Other Ambulatory Visit (HOSPITAL_COMMUNITY)
Admission: RE | Admit: 2024-05-22 | Discharge: 2024-05-22 | Disposition: A | Discharge status: Home / Self Care | Source: Ambulatory Visit | Attending: Nurse Practitioner | Admitting: Nurse Practitioner

## 2024-05-22 ENCOUNTER — Other Ambulatory Visit (HOSPITAL_COMMUNITY)
Admission: RE | Admit: 2024-05-22 | Discharge: 2024-05-22 | Disposition: A | Discharge status: Home / Self Care | Source: Ambulatory Visit | Attending: Nephrology | Admitting: Nephrology

## 2024-05-22 DIAGNOSIS — E785 Hyperlipidemia, unspecified: Secondary | ICD-10-CM | POA: Insufficient documentation

## 2024-05-22 DIAGNOSIS — I2510 Atherosclerotic heart disease of native coronary artery without angina pectoris: Secondary | ICD-10-CM | POA: Diagnosis present

## 2024-05-22 DIAGNOSIS — N1832 Chronic kidney disease, stage 3b: Secondary | ICD-10-CM | POA: Insufficient documentation

## 2024-05-22 DIAGNOSIS — Z79899 Other long term (current) drug therapy: Secondary | ICD-10-CM | POA: Diagnosis present

## 2024-05-22 LAB — LIPID PANEL
Cholesterol: 123 mg/dL (ref 0–200)
HDL: 63 mg/dL
LDL Cholesterol: 46 mg/dL (ref 0–99)
Total CHOL/HDL Ratio: 2 ratio
Triglycerides: 69 mg/dL
VLDL: 14 mg/dL (ref 0–40)

## 2024-05-22 LAB — RENAL FUNCTION PANEL
Albumin: 4.2 g/dL (ref 3.5–5.0)
Anion gap: 13 (ref 5–15)
BUN: 24 mg/dL — ABNORMAL HIGH (ref 8–23)
CO2: 22 mmol/L (ref 22–32)
Calcium: 9.4 mg/dL (ref 8.9–10.3)
Chloride: 102 mmol/L (ref 98–111)
Creatinine, Ser: 1.7 mg/dL — ABNORMAL HIGH (ref 0.61–1.24)
GFR, Estimated: 40 mL/min — ABNORMAL LOW
Glucose, Bld: 87 mg/dL (ref 70–99)
Phosphorus: 3.6 mg/dL (ref 2.5–4.6)
Potassium: 4.3 mmol/L (ref 3.5–5.1)
Sodium: 137 mmol/L (ref 135–145)

## 2024-05-22 LAB — HEPATIC FUNCTION PANEL
ALT: 28 U/L (ref 0–44)
AST: 24 U/L (ref 15–41)
Albumin: 4.2 g/dL (ref 3.5–5.0)
Alkaline Phosphatase: 62 U/L (ref 38–126)
Bilirubin, Direct: 0.2 mg/dL (ref 0.0–0.2)
Indirect Bilirubin: 0.2 mg/dL — ABNORMAL LOW (ref 0.3–0.9)
Total Bilirubin: 0.4 mg/dL (ref 0.0–1.2)
Total Protein: 7.4 g/dL (ref 6.5–8.1)

## 2024-05-22 NOTE — Progress Notes (Signed)
 Plastic and Reconstructive Surgery Clinic Note  Chief Complaint: No chief complaint on file.    Referred by: Gladis  History of Present Illness:  Subjective  Larry Castillo is a 84 y.o. male who presents for bilateral upper lid ptosis with drooping of the upper eyelids, which has decreased vision.  This has made it difficult for the patient to perform certain daily activities, especially at night. The drooping and cut off of vision progressively worsens throughout the evening as the patient tires.  The patient is able to compensate by forcibly opening their eyelids and raising their brow, however, the drooping makes it difficult with normal gaze to see in an upward visual field.     Past Medical History:  Medical History[1]  Problem List There is no problem list on file for this patient.[2]  Past Surgical History: Surgical History[3]  Social History: Social History   Tobacco Use   Smoking status: Never    Passive exposure: Never   Smokeless tobacco: Never  Substance Use Topics   Alcohol use: Never    Family History: family history is not on file.  Allergies: Allergies[4]  Medications: has a current medication list which includes the following prescription(s): azelastine, carvedilol , docosahexaenoic acid/epa, jardiance , levocetirizine, timolol, amlodipine , aspirin, ezetimibe , gabapentin, rosuvastatin , and tamsulosin.  Review of Systems: A 14 point review of systems was obtained and negative unless otherwise stated in the HPI  Physical Examination:  height is 1.829 m (6') and weight is 107 kg (236 lb 12.8 oz). His skin temperature is 98.4 F (36.9 C). His blood pressure is 138/66 and his pulse is 64.  Body mass index is 32.12 kg/m.  Gen: well appearing male in no acute distress Psych: alert, normal mood/affect Neuro: follows commands, CN VII function symmetric ENT:  mucous membranes moist and pink CV: no clubbing or cyanosis Resp: breathing comfortably on room air,  non-labored, no distress Skin: warm, no visible cyanosis Extrem: warm, well perfused Eyes:  Pupils equal, round Levator function good, MRD1 3mm Occular motility full No lagophthalmos  Assessment and Plan:  Problem List Items Addressed This Visit   None    84 y.o. male with bilateral upper lid ptosis  -The natural history, relevant anatomy, and expected clinical course associated with the patients diagnosis were discussed in detail with the patient, including potential treatment options and the risks/benefits associated with these options.  I have referred the patient to [facility] for further surgical treatment.    Ozell JULIANNA Hock, MD  Plastic & Reconstructive Surgery [facility] [place] 05/22/2024 11:53 AM         [1] No past medical history on file. [2] There is no problem list on file for this patient. [3] No past surgical history on file. [4] No Known Allergies

## 2024-06-04 ENCOUNTER — Ambulatory Visit: Payer: Self-pay | Admitting: Nurse Practitioner

## 2024-06-05 NOTE — Telephone Encounter (Signed)
-----   Message from Almarie Crate, NP sent at 06/04/2024  8:38 AM EST ----- Labs look good.   Almarie Crate, AGNP-C

## 2024-06-05 NOTE — Telephone Encounter (Signed)
 The patient has been notified of the result and verbalized understanding.  All questions (if any) were answered. Littie CHRISTELLA Croak, CMA 06/05/2024 9:45 AM

## 2024-07-10 ENCOUNTER — Ambulatory Visit: Admitting: Nurse Practitioner
